# Patient Record
Sex: Female | Born: 1965
Health system: Southern US, Community
[De-identification: ages and names within clinical notes are randomized; demographics above are authoritative.]

## PROBLEM LIST (undated history)

## (undated) DIAGNOSIS — K589 Irritable bowel syndrome without diarrhea: Secondary | ICD-10-CM

## (undated) DIAGNOSIS — K5909 Other constipation: Secondary | ICD-10-CM

## (undated) DIAGNOSIS — K861 Other chronic pancreatitis: Secondary | ICD-10-CM

## (undated) DIAGNOSIS — R1084 Generalized abdominal pain: Secondary | ICD-10-CM

## (undated) DIAGNOSIS — E538 Deficiency of other specified B group vitamins: Secondary | ICD-10-CM

## (undated) DIAGNOSIS — A6009 Herpesviral infection of other urogenital tract: Secondary | ICD-10-CM

## (undated) DIAGNOSIS — N951 Menopausal and female climacteric states: Secondary | ICD-10-CM

## (undated) DIAGNOSIS — R635 Abnormal weight gain: Secondary | ICD-10-CM

## (undated) DIAGNOSIS — R1012 Left upper quadrant pain: Secondary | ICD-10-CM

## (undated) DIAGNOSIS — R1013 Epigastric pain: Secondary | ICD-10-CM

## (undated) DIAGNOSIS — N920 Excessive and frequent menstruation with regular cycle: Secondary | ICD-10-CM

## (undated) DIAGNOSIS — A6000 Herpesviral infection of urogenital system, unspecified: Secondary | ICD-10-CM

## (undated) DIAGNOSIS — R110 Nausea: Secondary | ICD-10-CM

## (undated) DIAGNOSIS — M25561 Pain in right knee: Secondary | ICD-10-CM

## (undated) DIAGNOSIS — K859 Acute pancreatitis without necrosis or infection, unspecified: Secondary | ICD-10-CM

## (undated) DIAGNOSIS — K5900 Constipation, unspecified: Secondary | ICD-10-CM

## (undated) DIAGNOSIS — J302 Other seasonal allergic rhinitis: Secondary | ICD-10-CM

## (undated) DIAGNOSIS — M4120 Other idiopathic scoliosis, site unspecified: Secondary | ICD-10-CM

## (undated) DIAGNOSIS — D473 Essential (hemorrhagic) thrombocythemia: Secondary | ICD-10-CM

## (undated) DIAGNOSIS — R232 Flushing: Secondary | ICD-10-CM

## (undated) HISTORY — PX: APPENDECTOMY: SHX54

## (undated) HISTORY — DX: Pain in right knee: M25.561

---
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
## 1995-08-10 HISTORY — PX: SPLENECTOMY, TOTAL: SHX788

## 1997-06-12 ENCOUNTER — Ambulatory Visit (HOSPITAL_COMMUNITY): Admission: RE | Admit: 1997-06-12 | Discharge: 1997-06-12 | Payer: Self-pay | Admitting: Family Medicine

## 1999-04-10 ENCOUNTER — Encounter: Payer: Self-pay | Admitting: Gastroenterology

## 1999-04-10 ENCOUNTER — Encounter: Admission: RE | Admit: 1999-04-10 | Discharge: 1999-04-10 | Payer: Self-pay | Admitting: Gastroenterology

## 1999-04-17 ENCOUNTER — Encounter: Payer: Self-pay | Admitting: Gastroenterology

## 1999-04-17 ENCOUNTER — Encounter: Admission: RE | Admit: 1999-04-17 | Discharge: 1999-04-17 | Payer: Self-pay | Admitting: Gastroenterology

## 1999-10-13 ENCOUNTER — Other Ambulatory Visit: Admission: RE | Admit: 1999-10-13 | Discharge: 1999-10-13 | Payer: Self-pay | Admitting: Obstetrics and Gynecology

## 2000-05-30 ENCOUNTER — Encounter: Payer: Self-pay | Admitting: Family Medicine

## 2000-05-30 ENCOUNTER — Encounter: Admission: RE | Admit: 2000-05-30 | Discharge: 2000-05-30 | Payer: Self-pay | Admitting: Family Medicine

## 2000-07-01 ENCOUNTER — Encounter: Payer: Self-pay | Admitting: Internal Medicine

## 2000-07-01 ENCOUNTER — Ambulatory Visit (HOSPITAL_COMMUNITY): Admission: RE | Admit: 2000-07-01 | Discharge: 2000-07-01 | Payer: Self-pay | Admitting: Internal Medicine

## 2001-08-18 ENCOUNTER — Ambulatory Visit (HOSPITAL_COMMUNITY): Admission: RE | Admit: 2001-08-18 | Discharge: 2001-08-18 | Payer: Self-pay | Admitting: Internal Medicine

## 2001-08-18 ENCOUNTER — Encounter: Payer: Self-pay | Admitting: Internal Medicine

## 2002-03-22 HISTORY — PX: KNEE ARTHROSCOPY: SUR90

## 2003-02-18 ENCOUNTER — Other Ambulatory Visit: Admission: RE | Admit: 2003-02-18 | Discharge: 2003-02-18 | Payer: Self-pay | Admitting: *Deleted

## 2003-05-18 ENCOUNTER — Emergency Department (HOSPITAL_COMMUNITY): Admission: EM | Admit: 2003-05-18 | Discharge: 2003-05-18 | Payer: Self-pay | Admitting: Emergency Medicine

## 2003-05-22 ENCOUNTER — Ambulatory Visit (HOSPITAL_COMMUNITY): Admission: RE | Admit: 2003-05-22 | Discharge: 2003-05-22 | Payer: Self-pay | Admitting: Internal Medicine

## 2003-06-24 ENCOUNTER — Encounter: Payer: Self-pay | Admitting: Internal Medicine

## 2003-08-21 ENCOUNTER — Encounter: Admission: RE | Admit: 2003-08-21 | Discharge: 2003-11-19 | Payer: Self-pay

## 2004-03-31 ENCOUNTER — Other Ambulatory Visit: Admission: RE | Admit: 2004-03-31 | Discharge: 2004-03-31 | Payer: Self-pay | Admitting: *Deleted

## 2004-10-28 ENCOUNTER — Encounter: Admission: RE | Admit: 2004-10-28 | Discharge: 2004-12-14 | Payer: Self-pay | Admitting: *Deleted

## 2005-04-14 ENCOUNTER — Other Ambulatory Visit: Admission: RE | Admit: 2005-04-14 | Discharge: 2005-04-14 | Payer: Self-pay | Admitting: *Deleted

## 2005-05-11 ENCOUNTER — Ambulatory Visit: Payer: Self-pay | Admitting: Internal Medicine

## 2005-06-04 ENCOUNTER — Encounter (INDEPENDENT_AMBULATORY_CARE_PROVIDER_SITE_OTHER): Payer: Self-pay | Admitting: Specialist

## 2005-06-04 ENCOUNTER — Ambulatory Visit: Payer: Self-pay | Admitting: Internal Medicine

## 2006-04-20 ENCOUNTER — Other Ambulatory Visit: Admission: RE | Admit: 2006-04-20 | Discharge: 2006-04-20 | Payer: Self-pay | Admitting: *Deleted

## 2006-08-24 ENCOUNTER — Emergency Department (HOSPITAL_COMMUNITY): Admission: EM | Admit: 2006-08-24 | Discharge: 2006-08-24 | Payer: Self-pay | Admitting: Emergency Medicine

## 2007-01-31 ENCOUNTER — Ambulatory Visit: Payer: Self-pay | Admitting: Internal Medicine

## 2007-04-24 ENCOUNTER — Other Ambulatory Visit: Admission: RE | Admit: 2007-04-24 | Discharge: 2007-04-24 | Payer: Self-pay | Admitting: Family Medicine

## 2007-05-11 DIAGNOSIS — K859 Acute pancreatitis without necrosis or infection, unspecified: Secondary | ICD-10-CM | POA: Insufficient documentation

## 2007-05-11 DIAGNOSIS — A6000 Herpesviral infection of urogenital system, unspecified: Secondary | ICD-10-CM

## 2007-05-11 DIAGNOSIS — K589 Irritable bowel syndrome without diarrhea: Secondary | ICD-10-CM

## 2007-05-11 HISTORY — DX: Acute pancreatitis without necrosis or infection, unspecified: K85.90

## 2007-05-11 HISTORY — DX: Herpesviral infection of urogenital system, unspecified: A60.00

## 2007-05-11 HISTORY — DX: Irritable bowel syndrome, unspecified: K58.9

## 2008-04-24 ENCOUNTER — Encounter: Payer: Self-pay | Admitting: Internal Medicine

## 2008-04-24 ENCOUNTER — Other Ambulatory Visit: Admission: RE | Admit: 2008-04-24 | Discharge: 2008-04-24 | Payer: Self-pay | Admitting: Family Medicine

## 2008-05-23 DIAGNOSIS — M4120 Other idiopathic scoliosis, site unspecified: Secondary | ICD-10-CM | POA: Insufficient documentation

## 2008-05-23 DIAGNOSIS — Z860100 Personal history of colon polyps, unspecified: Secondary | ICD-10-CM | POA: Insufficient documentation

## 2008-05-23 HISTORY — DX: Other idiopathic scoliosis, site unspecified: M41.20

## 2008-05-23 HISTORY — DX: Personal history of colon polyps, unspecified: Z86.0100

## 2008-05-23 HISTORY — DX: Personal history of colonic polyps: Z86.010

## 2008-05-29 ENCOUNTER — Ambulatory Visit: Payer: Self-pay | Admitting: Internal Medicine

## 2008-05-29 DIAGNOSIS — R1012 Left upper quadrant pain: Secondary | ICD-10-CM

## 2008-05-29 HISTORY — DX: Left upper quadrant pain: R10.12

## 2008-05-29 LAB — CONVERTED CEMR LAB: Retic Ct Pct: 1.2 % (ref 0.4–3.1)

## 2008-05-30 LAB — CONVERTED CEMR LAB
Lipase: 33 units/L (ref 11.0–59.0)
Vitamin B-12: 373 pg/mL (ref 211–911)

## 2008-06-03 ENCOUNTER — Telehealth: Payer: Self-pay | Admitting: Internal Medicine

## 2008-06-19 ENCOUNTER — Telehealth: Payer: Self-pay | Admitting: Internal Medicine

## 2008-06-25 ENCOUNTER — Ambulatory Visit (HOSPITAL_COMMUNITY): Admission: RE | Admit: 2008-06-25 | Discharge: 2008-06-25 | Payer: Self-pay | Admitting: Internal Medicine

## 2008-11-15 ENCOUNTER — Encounter (INDEPENDENT_AMBULATORY_CARE_PROVIDER_SITE_OTHER): Payer: Self-pay | Admitting: *Deleted

## 2008-11-19 ENCOUNTER — Ambulatory Visit: Payer: Self-pay | Admitting: Internal Medicine

## 2008-11-19 LAB — CONVERTED CEMR LAB
ALT: 16 units/L (ref 0–35)
AST: 28 units/L (ref 0–37)
Albumin: 4.1 g/dL (ref 3.5–5.2)
Amylase: 107 units/L (ref 27–131)
BUN: 17 mg/dL (ref 6–23)
Eosinophils Relative: 2.4 % (ref 0.0–5.0)
Glucose, Bld: 86 mg/dL (ref 70–99)
HCT: 41 % (ref 36.0–46.0)
Lipase: 28 units/L (ref 11.0–59.0)
Lymphocytes Relative: 31 % (ref 12.0–46.0)
Lymphs Abs: 2.4 10*3/uL (ref 0.7–4.0)
MCV: 103.1 fL — ABNORMAL HIGH (ref 78.0–100.0)
Monocytes Absolute: 0.5 10*3/uL (ref 0.1–1.0)
Monocytes Relative: 6.8 % (ref 3.0–12.0)
Neutrophils Relative %: 59.3 % (ref 43.0–77.0)
Platelets: 445 10*3/uL — ABNORMAL HIGH (ref 150.0–400.0)
Potassium: 4.2 meq/L (ref 3.5–5.1)
RBC: 3.98 M/uL (ref 3.87–5.11)
Sodium: 140 meq/L (ref 135–145)
WBC: 7.7 10*3/uL (ref 4.5–10.5)

## 2008-11-27 ENCOUNTER — Ambulatory Visit: Payer: Self-pay | Admitting: Internal Medicine

## 2008-11-27 ENCOUNTER — Encounter: Payer: Self-pay | Admitting: Internal Medicine

## 2008-11-29 ENCOUNTER — Encounter: Payer: Self-pay | Admitting: Internal Medicine

## 2008-12-20 ENCOUNTER — Ambulatory Visit: Payer: Self-pay | Admitting: Internal Medicine

## 2008-12-20 DIAGNOSIS — E538 Deficiency of other specified B group vitamins: Secondary | ICD-10-CM | POA: Insufficient documentation

## 2008-12-20 HISTORY — DX: Deficiency of other specified B group vitamins: E53.8

## 2009-01-20 ENCOUNTER — Ambulatory Visit: Payer: Self-pay | Admitting: Internal Medicine

## 2009-02-03 ENCOUNTER — Encounter: Payer: Self-pay | Admitting: Internal Medicine

## 2009-04-14 ENCOUNTER — Ambulatory Visit: Payer: Self-pay | Admitting: Internal Medicine

## 2009-05-13 ENCOUNTER — Other Ambulatory Visit: Admission: RE | Admit: 2009-05-13 | Discharge: 2009-05-13 | Payer: Self-pay | Admitting: Family Medicine

## 2009-05-14 ENCOUNTER — Ambulatory Visit: Payer: Self-pay | Admitting: Internal Medicine

## 2009-06-11 ENCOUNTER — Ambulatory Visit: Payer: Self-pay | Admitting: Internal Medicine

## 2009-08-27 ENCOUNTER — Ambulatory Visit: Payer: Self-pay | Admitting: Internal Medicine

## 2009-09-29 ENCOUNTER — Ambulatory Visit: Payer: Self-pay | Admitting: Internal Medicine

## 2009-11-03 ENCOUNTER — Ambulatory Visit: Payer: Self-pay | Admitting: Internal Medicine

## 2009-11-05 ENCOUNTER — Ambulatory Visit: Payer: Self-pay | Admitting: Internal Medicine

## 2009-11-28 ENCOUNTER — Encounter: Payer: Self-pay | Admitting: Physician Assistant

## 2009-11-28 ENCOUNTER — Telehealth: Payer: Self-pay | Admitting: Internal Medicine

## 2009-11-28 ENCOUNTER — Ambulatory Visit: Payer: Self-pay | Admitting: Gastroenterology

## 2009-11-28 DIAGNOSIS — R635 Abnormal weight gain: Secondary | ICD-10-CM

## 2009-11-28 DIAGNOSIS — M25561 Pain in right knee: Secondary | ICD-10-CM | POA: Insufficient documentation

## 2009-11-28 DIAGNOSIS — K5900 Constipation, unspecified: Secondary | ICD-10-CM

## 2009-11-28 DIAGNOSIS — R110 Nausea: Secondary | ICD-10-CM | POA: Insufficient documentation

## 2009-11-28 DIAGNOSIS — R1084 Generalized abdominal pain: Secondary | ICD-10-CM

## 2009-11-28 DIAGNOSIS — R1013 Epigastric pain: Secondary | ICD-10-CM | POA: Insufficient documentation

## 2009-11-28 DIAGNOSIS — K861 Other chronic pancreatitis: Secondary | ICD-10-CM | POA: Insufficient documentation

## 2009-11-28 HISTORY — DX: Constipation, unspecified: K59.00

## 2009-11-28 HISTORY — DX: Abnormal weight gain: R63.5

## 2009-11-28 HISTORY — DX: Generalized abdominal pain: R10.84

## 2009-11-28 HISTORY — DX: Epigastric pain: R10.13

## 2009-11-28 HISTORY — DX: Nausea: R11.0

## 2009-12-01 ENCOUNTER — Ambulatory Visit: Payer: Self-pay | Admitting: Internal Medicine

## 2009-12-02 LAB — CONVERTED CEMR LAB
ALT: 15 units/L (ref 0–35)
AST: 21 units/L (ref 0–37)
CRP, High Sensitivity: 0.41 (ref 0.00–5.00)
Calcium: 9.4 mg/dL (ref 8.4–10.5)
Chloride: 105 meq/L (ref 96–112)
Creatinine, Ser: 0.7 mg/dL (ref 0.4–1.2)
Eosinophils Relative: 6.3 % — ABNORMAL HIGH (ref 0.0–5.0)
HCT: 38.7 % (ref 36.0–46.0)
Hemoglobin: 13.4 g/dL (ref 12.0–15.0)
Lymphs Abs: 3.4 10*3/uL (ref 0.7–4.0)
Monocytes Relative: 6.6 % (ref 3.0–12.0)
Neutro Abs: 7.3 10*3/uL (ref 1.4–7.7)
RDW: 14 % (ref 11.5–14.6)
Sodium: 139 meq/L (ref 135–145)
WBC: 12.4 10*3/uL — ABNORMAL HIGH (ref 4.5–10.5)

## 2009-12-11 ENCOUNTER — Telehealth: Payer: Self-pay | Admitting: Internal Medicine

## 2009-12-12 ENCOUNTER — Ambulatory Visit: Payer: Self-pay | Admitting: Internal Medicine

## 2009-12-15 ENCOUNTER — Telehealth: Payer: Self-pay | Admitting: Internal Medicine

## 2009-12-16 ENCOUNTER — Ambulatory Visit: Payer: Self-pay | Admitting: Cardiovascular Disease

## 2009-12-29 ENCOUNTER — Ambulatory Visit: Payer: Self-pay | Admitting: Internal Medicine

## 2010-01-07 ENCOUNTER — Ambulatory Visit: Payer: Self-pay | Admitting: Internal Medicine

## 2010-02-04 ENCOUNTER — Ambulatory Visit: Payer: Self-pay | Admitting: Internal Medicine

## 2010-02-11 ENCOUNTER — Telehealth: Payer: Self-pay | Admitting: Internal Medicine

## 2010-04-12 ENCOUNTER — Encounter: Payer: Self-pay | Admitting: Internal Medicine

## 2010-04-23 NOTE — Progress Notes (Signed)
Summary: triage   Phone Note Call from Patient Call back at [phone]   Caller: Patient Call For: Dr. Juanda Chance Reason for Call: Talk to Nurse Summary of Call: pt has severe headaches and head pressure, stomach burning and pain, fluid retention, weight gain, unable to have a BM... has an ov tomorrow, but thinks she might need some kind of test scheduled for tomorrow instead Initial call taken by: Vallarie Mare,  December 11, 2009 9:08 AM  Follow-up for Phone Call        Dr Juanda Chance these are the same symptoms she had when she saw Mike Gip PA on 11/28/09, do you want any testing done prior to her appointment tomorrow? Follow-up by: Darcey Nora RN, CGRN,  December 11, 2009 10:38 AM  Additional Follow-up for Phone Call Additional follow up Details #1::        It sounds like she may need to see her PCP at  [facility] for a complete physical exam and comprehensive tests. For now she may try Align for the bloating etc.Her GI SWx'a are from an IBS but I cannot be sure that something else is going on. Additional Follow-up by: Hart Carwin MD,  December 11, 2009 1:19 PM    Additional Follow-up for Phone Call Additional follow up Details #2::    Patient advised of Dr Regino Schultze recommendations.  She wants to keep her appointment for tomorrow with Dr Juanda Chance.   Follow-up by: Darcey Nora RN, CGRN,  December 11, 2009 2:22 PM

## 2010-04-23 NOTE — Assessment & Plan Note (Signed)
Summary: MONTHLY B12 SHOT  Nurse Visit   Allergies: 1)  ! Amoxicillin 2)  ! * Ivp Dye 3)  ! * Bcp  Medication Administration  Injection # 1:    Medication: Vit B12 1000 mcg    Diagnosis: B12 DEFICIENCY (ICD-266.2)    Route: IM    Site: L deltoid    Exp Date: 06/21/2011    Lot #: [id number]    Mfr: [employer]    Patient tolerated injection without complications    Given by: Harlow Mares CMA (AAMA) (November 03, 2009 8:32 AM)  Orders Added: 1)  Vit B12 1000 mcg [J3420]

## 2010-04-23 NOTE — Assessment & Plan Note (Signed)
Summary: monthly b12 injection/dn  Nurse Visit   Allergies: 1)  ! Amoxicillin 2)  ! * Ivp Dye 3)  ! * Bcp  Medication Administration  Injection # 1:    Medication: Vit B12 1000 mcg    Diagnosis: B12 DEFICIENCY (ICD-266.2)    Route: IM    Site: L deltoid    Exp Date: 06/2011    Lot #: [id number]    Mfr: [employer]    Comments: pt to schedule next monthly b12 at front desk    Patient tolerated injection without complications    Given by: Chales Abrahams CMA Duncan Dull) (December 01, 2009 11:16 AM)  Orders Added: 1)  Vit B12 1000 mcg [J3420]

## 2010-04-23 NOTE — Progress Notes (Signed)
Summary: triage   Phone Note Call from Patient   Caller: Patient Call For: Dr. Juanda Chance Reason for Call: Talk to Nurse Summary of Call: pt walked in and asked to be seen right now stating that she thinks she has Pancreatitis and doesnt want to go through the weekend without being seen... pt waiting outside previsit... Cheryl notified Initial call taken by: Vallarie Mare,  November 28, 2009 3:07 PM  Follow-up for Phone Call        Says she has rt. and lt upper quadrant pain all this week along with nausea.Has chronic pain but says pain has become more intense. Discussed with PA and she will see pt. Follow-up by: Teryl Lucy RN,  November 28, 2009 3:16 PM

## 2010-04-23 NOTE — Assessment & Plan Note (Signed)
Summary: nausea /upper abd. pain/pancreatitis?    History of Present Illness Visit Type: Follow-up Visit Primary GI MD: Lina Sar MD Primary Toniyah Dilmore: [facility] Physician Requesting Jayzen Paver: na Chief Complaint: Headaches, generalized abd pain, nausea, and weight gain  History of Present Illness:   PLEASANT 44 YO FEMALE KNOWN TO DR. Juanda Chance WITH HX OF IBS AND PROBABLE CHRONIC PANCREATITS. SHE IS S/P SPLENECTOMY AND PARTIAL PANCREATECTOMY IN 1994 FOR A BENIGN SPLENIC MASS. SHE WAS SEEN ON 11/05/09 WITH NAUSEA,CONSTIPATION,JUST NOT FEELING WELL. PT WALK IN TODAY ASKING TO BE SEEN. SHE FEELS BAD. SHE C/O HEADACHE WHICH HAS BEEN CONSTANT ALL WEEK,BADLIKE ON TOP OF HEAD. WHEN IT IS BAD SHE FEELS NAUSEATED, AND DIZZY. SHE ALSO C/O CONSTANT BURNING UPPER ABDOMINAL PAIN,BLOATING GAS,"FEELS BLOCKED". HAS BEEN HAVING BM'S DAILY, NOT EVACUATING WELL. APPETITE POOR,NO VOMITING. HAS BEEN HAVING SPELLS OF FEELING VERY HOT-HAS NOT TAKEN TEMP. SHE WENT CAMPING FOR 9 DAYS MID AUGUST, NO STREAM WATER ETC, DID HAVE A SPIDER BITE, AND ALSO HAD ONE TICK ON HER, NOT ENGORGED . NOT AWARE OF A RASH, NO VISUAL DISTURBANCES , HAS BEEN WORKING. ALSO SAYS SHE FEELS SWOLLEN ,LEGS TIGHT,HAS GAINED 9-10 POUNDS IN PAST COUPLE WEEKS-FEELS SHE IS RETAINING FLUID.   GI Review of Systems    Reports abdominal pain, nausea, and  weight gain.     Location of  Abdominal pain: generalized.    Denies acid reflux, belching, bloating, chest pain, dysphagia with liquids, dysphagia with solids, heartburn, loss of appetite, vomiting, vomiting blood, and  weight loss.        Denies anal fissure, black tarry stools, change in bowel habit, constipation, diarrhea, diverticulosis, fecal incontinence, heme positive stool, hemorrhoids, irritable bowel syndrome, jaundice, light color stool, liver problems, rectal bleeding, and  rectal pain.    Current Medications (verified): 1)  Zenpep 5000 Unit Cpep (Pancrelipase (Lip-Prot-Amyl)) .... Take  2 Tablets By Mouth Three Times A Day (Pharmacy, Please D/c [facility] Prescription) 2)  Tramadol Hcl 50 Mg Tabs (Tramadol Hcl) .... Take 1 Tablet By Mouth Every 6 Hours As Needed For Abdominal Pain 3)  Cyanocobalamin 1000 Mcg/ml Soln (Cyanocobalamin) .... Once Monthly 4)  Citrucel  Powd (Methylcellulose (Laxative)) .... As Needed  Allergies (verified): 1)  ! Amoxicillin 2)  ! * Ivp Dye 3)  ! * Bcp  Past History:  Past Medical History: Reviewed history from 05/29/2008 and no changes required. SCOLIOSIS (ICD-737.30) COLONIC POLYPS, HX OF (ICD-V12.72) GENITAL HERPES (ICD-054.10) IRRITABLE BOWEL SYNDROME (ICD-564.1) PANCREATITIS (ICD-577.0)  Past Surgical History: splenectomy/partial pancreatectomy 1994-benign splenic mass appendectomy Rt knee arthroscopy  Family History: Reviewed history from 05/23/2008 and no changes required. No FH of Colon Cancer: Family History of Colitis/Crohn's: Mother (hx of u.c) Family History of Ovarian Cancer: Grandmother Family History of Prostate Cancer: Father Family History of Heart Disease: Father  Social History: Reviewed history from 05/29/2008 and no changes required. Single Alcohol Use - no Illicit Drug Use - no Patient has never smoked.   Review of Systems       The patient complains of back pain and headaches-new.  The patient denies allergy/sinus, anemia, anxiety-new, arthritis/joint pain, blood in urine, breast changes/lumps, change in vision, confusion, cough, coughing up blood, depression-new, fainting, fatigue, fever, hearing problems, heart murmur, heart rhythm changes, itching, menstrual pain, muscle pains/cramps, night sweats, nosebleeds, pregnancy symptoms, shortness of breath, skin rash, sleeping problems, sore throat, swelling of feet/legs, swollen lymph glands, thirst - excessive , urination - excessive , urination changes/pain, urine leakage, vision changes, and voice change.  see hpi  Vital Signs:  Patient profile:    45 year old female Height:      63 inches Weight:      127 pounds BMI:     22.58 BSA:     1.60 Temp:     98.4 degrees F oral Pulse rate:   72 / minute Pulse rhythm:   regular BP sitting:   110 / 64  (left arm) Cuff size:   regular  Vitals Entered By: Ok Anis CMA (November 28, 2009 3:30 PM)  Physical Exam  General:  Well developed, well nourished, no acute distress. Head:  Normocephalic and atraumatic. Eyes:  PERRLA, no icterus. Neck:  Supple; no masses or thyromegaly. Lungs:  Clear throughout to auscultation. Heart:  Regular rate and rhythm; no murmurs, rubs,  or bruits. Abdomen:  soft, mild tenderness upper abdomen, bs+ , nondistended, no mass or hsm Rectal:  not done Extremities:  trace edema lower extremities, no rash, old mark from spider bite on left calf Neurologic:  Alert and  oriented x4;  grossly normal neurologically. Psych:  Alert and cooperative. Normal mood and affect.   Impression & Recommendations:  Problem # 1:  ABDOMINAL PAIN-EPIGASTRIC (ICD-789.06) 44  YO FEMALE WITH IBS,PROBABLE CHRONIC PANCREATITIS(S/P PARTIAL PANCREATECTOMY/SPLENECTOMY 94), NOW WITH 6 DAY HX OF HEADACHE /FRONTAL AND TOP OF HEAD,NUSEA,UPPER ABDOMINAL BURNING PAIN, SUBJECTIVE FEVERS,FATIGUE.  DO NOT THINK THIS IS FLARE OF PANCREAS. GIVEN HX OF CAMPING ETC-NEED TO R/O [place] SPOTTED FEVER,LYME DISEASE.  LABS AS BELOW TO INCLUDE TITERS FOR RMSF, AND LYME TREAT WITH DOXCYCLINE EMPIRICALLY 100 MF TWICE DAILY X 7 DAYS KUB-R/O OBSTIPATION,PARTIAL OBSTRUCTION -DOUBT OBSTRUCTION. PRILOSEC 20 MG TWICE  DAILY X 2 WEEKS USE ULTRAM AT HOME AS NEEDED FOR ABDOMINAL PAIN,HEADACHE CALL IF ANY OF SXS WORSEN OVER NEXT FEW DAYS,AND SHE WILL CALL MONDAY WITH REPORT.  Problem # 2:  CHRONIC PANCREATITIS (ICD-577.1) Assessment: Comment Only  Problem # 3:  CONSTIPATION (ICD-564.00) Assessment: Comment Only  Other Orders: KUB (KUB) TLB-CMP (Comprehensive Metabolic Pnl) (80053-COMP) TLB-Amylase  (82150-AMYL) TLB-Lipase (83690-LIPASE) TLB-CRP-High Sensitivity (C-Reactive Protein) (86140-FCRP) TLB-CBC Platelet - w/Differential (85025-CBCD) T- * Misc. Laboratory test [phone]) T- * Misc. Laboratory test [phone])  Patient Instructions: 1)  Copy sent to : [facility] Physician 2)  Take Ultram as needed for pain 3)  Continue Pancreatic enzymes 4)  Take prilosec sampels 1 by mouth two times a day  5)  Go to lab and xray department today 6)  We have sent in a prescription to your pharmacy 7)  Please call back Monday with an update 8)  The medication list was reviewed and reconciled.  All changed / newly prescribed medications were explained.  A complete medication list was provided to the patient / caregiver. Prescriptions: DOXYCYCLINE HYCLATE 100 MG TABS (DOXYCYCLINE HYCLATE) 1 by mouth two times a day FOR 7 DAYS  #14 x 0   Entered by:   Merri Ray CMA (AAMA)   Authorized by:   Sammuel Cooper PA-c   Signed by:   Merri Ray CMA (AAMA) on 11/28/2009   Method used:   Electronically to        [facility]. [phone]* (retail)       [address]       [place], [place]  [address]       Ph: [phone]       Fax: [phone]   RxID:   [id number]

## 2010-04-23 NOTE — Assessment & Plan Note (Signed)
Summary: MONTHLY B12 SHOT...LSW.  Nurse Visit   Allergies: 1)  ! Amoxicillin 2)  ! * Ivp Dye 3)  ! * Bcp  Medication Administration  Injection # 1:    Medication: Vit B12 1000 mcg    Diagnosis: B12 DEFICIENCY (ICD-266.2)    Route: IM    Site: L deltoid    Exp Date: 09/2011    Lot #: [id number]    Mfr: [employer]    Patient tolerated injection without complications    Given by: Merri Ray CMA (AAMA) (January 07, 2010 9:01 AM)  Orders Added: 1)  Vit B12 1000 mcg [J3420]

## 2010-04-23 NOTE — Assessment & Plan Note (Signed)
Summary: F/U Constipation, fluid rentention, saw PA    History of Present Illness Visit Type: Follow-up Visit Primary GI MD: Lina Sar MD Primary Provider: [facility] Physician Requesting Provider: na Chief Complaint: Generalized abd pain, constipation, gas, and back pain  History of Present Illness:   This is a 45 year old female with irritable bowel syndrome, questionable history of pancreatitis and chronic constipation. She has had an exacerbation of symptoms in the last several weeks and has missed a lot of work. She saw Sondra Come, PA-C on 11/28/09 and was treated with Prilosec. She has not had regular bowel movements in spite of taking laxatives. She has had a 5 pound weight gain, mostly fluid. She is also complaining of headaches and burning in her chest. He last colonoscopy in September 2010 was a normal exam. An endoscopy in March 2007 was normal with no evidence of gastritis. A MRI of the pancreas in April 2010 showed a normal-appearing pancreas. Her mother has ulcerative colitis. Patient  prefers homeopathic medications. She is also unable to take any liquid laxatives.   GI Review of Systems    Reports abdominal pain and  bloating.     Location of  Abdominal pain: generalized.    Denies acid reflux, belching, chest pain, dysphagia with liquids, dysphagia with solids, heartburn, loss of appetite, nausea, vomiting, vomiting blood, weight loss, and  weight gain.      Reports constipation.     Denies anal fissure, black tarry stools, change in bowel habit, diarrhea, diverticulosis, fecal incontinence, heme positive stool, hemorrhoids, irritable bowel syndrome, jaundice, light color stool, liver problems, rectal bleeding, and  rectal pain.    Current Medications (verified): 1)  Zenpep 5000 Unit Cpep (Pancrelipase (Lip-Prot-Amyl)) .... Take 2 Tablets By Mouth Three Times A Day (Pharmacy, Please D/c Creon Prescription) 2)  Tramadol Hcl 50 Mg Tabs (Tramadol Hcl) .... Take 1 Tablet By  Mouth Every 6 Hours As Needed For Abdominal Pain 3)  Cyanocobalamin 1000 Mcg/ml Soln (Cyanocobalamin) .... Once Monthly 4)  Citrucel  Pack (Methylcellulose (Laxative)) .... Will Take Two Capsules By Mouth Once Daily  Allergies (verified): 1)  ! Amoxicillin 2)  ! * Ivp Dye 3)  ! * Bcp  Past History:  Past Medical History: Reviewed history from 05/29/2008 and no changes required. SCOLIOSIS (ICD-737.30) COLONIC POLYPS, HX OF (ICD-V12.72) GENITAL HERPES (ICD-054.10) IRRITABLE BOWEL SYNDROME (ICD-564.1) PANCREATITIS (ICD-577.0)  Past Surgical History: Reviewed history from 11/28/2009 and no changes required. splenectomy/partial pancreatectomy 1994-benign splenic mass appendectomy Rt knee arthroscopy  Family History: Reviewed history from 05/23/2008 and no changes required. No FH of Colon Cancer: Family History of Colitis/Crohn's: Mother (hx of u.c) Family History of Ovarian Cancer: Grandmother Family History of Prostate Cancer: Father Family History of Heart Disease: Father  Social History: Reviewed history from 05/29/2008 and no changes required. Single Alcohol Use - no Illicit Drug Use - no Patient has never smoked.   Review of Systems       The patient complains of back pain and headaches-new.  The patient denies allergy/sinus, anemia, anxiety-new, arthritis/joint pain, blood in urine, breast changes/lumps, change in vision, confusion, cough, coughing up blood, depression-new, fainting, fatigue, fever, hearing problems, heart murmur, heart rhythm changes, itching, menstrual pain, muscle pains/cramps, night sweats, nosebleeds, pregnancy symptoms, shortness of breath, skin rash, sleeping problems, sore throat, swelling of feet/legs, swollen lymph glands, thirst - excessive , urination - excessive , urination changes/pain, urine leakage, vision changes, and voice change.         Pertinent positive  and negative review of systems were noted in the above HPI. All other ROS was  otherwise negative.   Vital Signs:  Patient profile:   45 year old female Height:      63 inches Weight:      122 pounds BMI:     21.69 BSA:     1.57 Pulse rate:   76 / minute Pulse rhythm:   regular BP sitting:   112 / 64  (left arm) Cuff size:   regular  Vitals Entered By: Ok Anis CMA (December 12, 2009 8:59 AM)  Physical Exam  General:  anxious somewhat distraught alert and oriented Eyes:  PERRLA, no icterus. Mouth:  No deformity or lesions, dentition normal. Neck:  Supple; no masses or thyromegaly. Chest Wall:  no chest wall tenderness Lungs:  Clear throughout to auscultation. Heart:  Regular rate and rhythm; no murmurs, rubs,  or bruits. Abdomen:  mild tenderness in left and right upper quadrants with normoactive bowel sounds. Well-healed surgical scar. Normal lower abdomen. No palpable mass of stool. Mild CVA tenderness bilaterally most likely musculoskeletal Rectal:  normal rectal tone was small amount of Hemoccult negative stool Extremities:  trace edema Skin:  Intact without significant lesions or rashes. Psych:  Alert and cooperative. Normal mood and affect.   Impression & Recommendations:  Problem # 1:  CONSTIPATION (ICD-564.00) Orders: CT Abdomen/Pelvis w/o Contrast (CT ABD/Pel w/o con)  Problem # 2:  ABDOMINAL PAIN-EPIGASTRIC (ICD-789.06) I suspect a functional abdominal pain., possible fibromyalgia. pt appeared agitated today and had  multiple unrelated complaints. I suggested Cymbalta but she did not want to take any psychotropic medication.   Patient is to continue Prilosec 20 mg daily  Problem # 3:  CHRONIC PANCREATITIS (ICD-577.1) This was not documented on an MRI or blood tests but she has a hx of partial pancreatectomy in the past while undergoing splenectomy. She takes tramadol 50 mg which helps at times.  Patient Instructions: 1)  CT scan of the abdomen and pelvis with oral contrast. 2)  Prilosec 20 mg daily. 3)  Probiotics daily. 4)  Prunes  and Senokot daily forconstipation ( " the natural" laxatives) 5)  May need a complete physical exam by her PCP to look for other sources of complaints, specifically the possibility of fibromyalgia 6)  Copy sent to : [facility] 7)  The medication list was reviewed and reconciled.  All changed / newly prescribed medications were explained.  A complete medication list was provided to the patient / caregiver.

## 2010-04-23 NOTE — Progress Notes (Signed)
Summary: Triage   Phone Note Call from Patient Call back at Home Phone [phone]   Caller: Patient Call For: Dr. Juanda Chance Reason for Call: Talk to Nurse Summary of Call: chest pain earlier this morning, tongue is coated...CT scan is sch'd for tomorrow Initial call taken by: Karna Christmas,  December 15, 2009 3:11 PM  Follow-up for Phone Call        Patient  advised to keep the appointment for the CT scan in the am, and she will be called with the results when they are available.  CP has resolved. Follow-up by: Darcey Nora RN, CGRN,  December 15, 2009 3:40 PM

## 2010-04-23 NOTE — Assessment & Plan Note (Signed)
Summary: ROUTINE F/U PANCREATITIS/DN    History of Present Illness Visit Type: Follow-up Visit Primary GI MD: Lina Sar MD Primary Provider: [facility] Requesting Provider: Clovis Riley, FNP Chief Complaint: pancreatitis, patient states that she feel blah, with some nausea History of Present Illness:   This is a 45 year old white female with irritable bowel syndrome and chronic abdominal pain and suspected chronic pancreatitis. She is status post partial pancreatectomy in 1994 and splenectomy for  benign splenic mass. She had a normal appearing pancreas on an MRI in April 2010. She has 2 accessory splenules. Her bowel habits have been irregular with predominant constipation. She takes pancreatic enzyme supplements with relief of some of her bloating. She had to miss work twice last month because of abdominal pain. Her weight has been stable. An upper endoscopy in 2007 was normal. A colonoscopy in September 2010 was also normal. A prior colonoscopy in 2005 showed polyps.   GI Review of Systems    Reports abdominal pain and  nausea.     Location of  Abdominal pain: left side.    Denies acid reflux, belching, bloating, chest pain, dysphagia with liquids, dysphagia with solids, heartburn, loss of appetite, vomiting, vomiting blood, weight loss, and  weight gain.      Reports change in bowel habits, constipation, and  diarrhea.     Denies anal fissure, black tarry stools, diverticulosis, fecal incontinence, heme positive stool, hemorrhoids, irritable bowel syndrome, jaundice, light color stool, liver problems, rectal bleeding, and  rectal pain.    Current Medications (verified): 1)  Creon 6000 Unit Cpep (Pancrelipase (Lip-Prot-Amyl)) .... Take 2 Tablets By Mouth Three Times A Day  Allergies (verified): 1)  ! Amoxicillin 2)  ! * Ivp Dye 3)  ! * Bcp  Past History:  Past Medical History: Reviewed history from 05/29/2008 and no changes required. SCOLIOSIS (ICD-737.30) COLONIC  POLYPS, HX OF (ICD-V12.72) GENITAL HERPES (ICD-054.10) IRRITABLE BOWEL SYNDROME (ICD-564.1) PANCREATITIS (ICD-577.0)  Past Surgical History: Reviewed history from 05/23/2008 and no changes required. splenectomy appendectomy Rt knee arthroscopy  Family History: Reviewed history from 05/23/2008 and no changes required. No FH of Colon Cancer: Family History of Colitis/Crohn's: Mother (hx of u.c) Family History of Ovarian Cancer: Grandmother Family History of Prostate Cancer: Father Family History of Heart Disease: Father  Social History: Reviewed history from 05/29/2008 and no changes required. Single Alcohol Use - no Illicit Drug Use - no Patient has never smoked.   Review of Systems       The patient complains of anemia, fever, muscle pains/cramps, night sweats, and sleeping problems.  The patient denies allergy/sinus, anxiety-new, arthritis/joint pain, back pain, blood in urine, breast changes/lumps, change in vision, confusion, cough, coughing up blood, depression-new, fainting, fatigue, headaches-new, hearing problems, heart murmur, heart rhythm changes, itching, menstrual pain, nosebleeds, pregnancy symptoms, shortness of breath, skin rash, sore throat, swelling of feet/legs, swollen lymph glands, thirst - excessive, urination - excessive, urination changes/pain, urine leakage, vision changes, and voice change.         Pertinent positive and negative review of systems were noted in the above HPI. All other ROS was otherwise negative.   Vital Signs:  Patient profile:   45 year old female Height:      63 inches Weight:      124.38 pounds BMI:     22.11 Pulse rate:   80 / minute Pulse rhythm:   regular BP sitting:   90 / 52  (left arm) Cuff size:  regular  Vitals Entered By: June McMurray CMA Duncan Dull) (November 05, 2009 8:36 AM)  Physical Exam  General:  Well developed, well nourished, no acute distress. Mouth:  No deformity or lesions, dentition normal. Neck:  Supple;  no masses or thyromegaly. Lungs:  Clear throughout to auscultation. Heart:  Regular rate and rhythm; no murmurs, rubs,  or bruits. Abdomen:  tender left upper quadrant along the costal margin and in epigastrium. Well-healed surgical scar in upper abdomen. Normoactive bowel sounds. No distention. Extremities:  trace edema in both lower extremities. Skin:  Intact without significant lesions or rashes. Psych:  Alert and cooperative. Normal mood and affect.   Impression & Recommendations:  Problem # 1:  B12 DEFICIENCY (ICD-266.2) Patient's last B12 level was 482 on 06/11/09. She continues on monthly supplements.  Problem # 2:  ABDOMINAL PAIN, LEFT UPPER QUADRANT (ICD-789.02) Patient has chronic abdominal pain, likely overlapped with irritable bowel syndrome, postoperative adhesions and possibly a low-grade pancreatitis without radiographic evidence of pancreatic abnormality. She will continue on her pancreatic enzymes.  Problem # 3:  COLONIC POLYPS, HX OF (ICD-V12.72) She is up-to-date on her colonoscopy. Her next exam will be due in 10 years which would be in 2019.  Patient Instructions: 1)  tramadol 50 mg p.r.n. abdominal pain. 2)  Start calcium supplements with vitamin D. 3)  Refill on pancreatic enzymes. 4)  Continue B12 supplements monthly. 5)  MiraLax 17 g p.r.n. constipation. 6)  Copy sent to : Dr Clovis Riley 7)  The medication list was reviewed and reconciled.  All changed / newly prescribed medications were explained.  A complete medication list was provided to the patient / caregiver. Prescriptions: TRAMADOL HCL 50 MG TABS (TRAMADOL HCL) Take 1 tablet by mouth every 6 hours as needed for abdominal pain  #60 x 0   Entered by:   Lamona Curl CMA (AAMA)   Authorized by:   Hart Carwin MD   Signed by:   Lamona Curl CMA (AAMA) on 11/05/2009   Method used:   Electronically to        [facility]. [phone]* (retail)       [address]       [place], [place]  [address]       Ph: [phone]       Fax: [phone]   RxID:   [id number]   Appended Document: ROUTINE F/U PANCREATITIS/DN Patient requested we change her from creon back to pancrealipase. Unfortunately, pancrealipase is no longer available on the market. Per Dr Juanda Chance, okay to try patient on Zenpep at its lowest dosage. I have called patient to advise her of the change in medication and I have sent in a new prescription for this. Dottie Nelson-Smith CMA (AAMA)  November 06, 2009 2:28 PM    Clinical Lists Changes  Medications: Changed medication from CREON 6000 UNIT CPEP (PANCRELIPASE (LIP-PROT-AMYL)) Take 2 tablets by mouth three times a day to ZENPEP 5000 UNIT CPEP (PANCRELIPASE (LIP-PROT-AMYL)) Take 2 tablets by mouth three times a day (pharmacy, please d/c creon prescription) - Signed Rx of ZENPEP 5000 UNIT CPEP (PANCRELIPASE (LIP-PROT-AMYL)) Take 2 tablets by mouth three times a day (pharmacy, please d/c creon prescription);  #180 x 2;  Signed;  Entered by: Lamona Curl CMA (AAMA);  Authorized by: Hart Carwin MD;  Method used: Electronically to [facility]. [phone]*, [address] [place], [place], [place]  [address], Ph: [phone], Fax: [phone]  Prescriptions: ZENPEP 5000 UNIT CPEP (PANCRELIPASE (LIP-PROT-AMYL)) Take 2 tablets by mouth three times a day (pharmacy, please d/c creon prescription)  #180 x 2   Entered by:   Lamona Curl CMA (AAMA)   Authorized by:   Hart Carwin MD   Signed by:   Lamona Curl CMA (AAMA) on 11/06/2009   Method used:   Electronically to        [facility]. [phone]* (retail)       [address]       [place], [place]  [address]       Ph: [phone]       Fax: [phone]   RxID:   [id number]

## 2010-04-23 NOTE — Assessment & Plan Note (Signed)
Summary: monthly b12...as.  Nurse Visit  Medication Administration  Injection # 1:    Medication: Vit B12 1000 mcg    Diagnosis: B12 DEFICIENCY (ICD-266.2)    Route: IM    Site: R deltoid    Exp Date: 06/2011    Lot #: [id number]    Comments: Manufactured by [employer] Pt will return in one month for next injection.  Advised pt of national shortage and the possibility we may not have any B12 at the time of her next appt. Pt voices understanding and is aware we will call her in advance if we need to cancel her appt.    Patient tolerated injection without complications    Given by: Francee Piccolo CMA Duncan Dull) (August 27, 2009 8:59 AM)  Orders Added: 1)  Vit B12 1000 mcg [J3420]

## 2010-04-23 NOTE — Progress Notes (Signed)
Summary: Changed GI   Phone Note Outgoing Call   Call placed by: Lamona Curl CMA Duncan Dull),  February 11, 2010 8:38 AM Call placed to: Patient Summary of Call: We recieved a release of information request for patient's information to go to [facility] (patient was seen there 02/10/10). I called patient to advise her that due to her transfer of care to [facility], we will be canceling her b12 appointment for 03/09/10. Patient states that she "isnt neccesarily transfering care but is getting a second opinion." I have advised her that we consider going to another GI without discussing with Dr Juanda Chance first as a transfer of care and that if she would like to see Dr Juanda Chance again, then she would need to have all [facility] information faxed to our office for Dr Juanda Chance to review and decide if she would like to accept patient back. Patient verbalizes understanding. Initial call taken by: Lamona Curl CMA Duncan Dull),  February 11, 2010 8:41 AM

## 2010-04-23 NOTE — Assessment & Plan Note (Signed)
Summary: b12/yf  # 3 of 6  Nurse Visit   Allergies: 1)  ! Amoxicillin 2)  ! * Ivp Dye 3)  ! * Bcp  Medication Administration  Injection # 1:    Medication: Vit B12 1000 mcg    Diagnosis: B12 DEFICIENCY (ICD-266.2)    Route: IM    Site: L deltoid    Exp Date: 01/2011    Lot #: [id number]    Mfr: [employer]    Comments: pt to schedule next monthly at front desk.    Patient tolerated injection without complications    Given by: Chales Abrahams CMA Duncan Dull) (April 14, 2009 8:46 AM)  Orders Added: 1)  Vit B12 1000 mcg [J3420]

## 2010-04-23 NOTE — Assessment & Plan Note (Signed)
Summary: MONTHLY B12 SHOT...LSW.  Nurse Visit   Allergies: 1)  ! Amoxicillin 2)  ! * Ivp Dye 3)  ! * Bcp  Medication Administration  Injection # 1:    Medication: Vit B12 1000 mcg    Diagnosis: B12 DEFICIENCY (ICD-266.2)    Route: IM    Site: L deltoid    Exp Date: 06/2011    Lot #: [id number]    Mfr: [employer]    Patient tolerated injection without complications    Given by: Merri Ray CMA (AAMA) (September 29, 2009 8:43 AM)  Orders Added: 1)  Vit B12 1000 mcg [J3420]

## 2010-04-23 NOTE — Assessment & Plan Note (Signed)
Summary: MONTHLY B12 SHOT...LSW.  Nurse Visit   Allergies: 1)  ! Amoxicillin 2)  ! * Ivp Dye 3)  ! * Bcp  Medication Administration  Injection # 1:    Medication: Vit B12 1000 mcg    Diagnosis: B12 DEFICIENCY (ICD-266.2)    Route: IM    Site: R deltoid    Exp Date: 10/2010    Lot #: [id number]    Mfr: [employer]    Comments: pt scheduled for next monthly B12    Patient tolerated injection without complications    Given by: Merri Ray CMA Duncan Dull) (May 14, 2009 8:50 AM)  Orders Added: 1)  Vit B12 1000 mcg [J3420]

## 2010-04-23 NOTE — Assessment & Plan Note (Signed)
Summary: MONTHLY B12 SHOT...LSW.  Nurse Visit   Allergies: 1)  ! Amoxicillin 2)  ! * Ivp Dye 3)  ! * Bcp  Medication Administration  Injection # 1:    Medication: Vit B12 1000 mcg    Diagnosis: B12 DEFICIENCY (ICD-266.2)    Route: IM    Site: L deltoid    Exp Date: 10/2011    Lot #: [id number]    Mfr: [employer]    Comments: Monthly B12 Injection. Patient will make appointment for next B12 injection    Patient tolerated injection without complications    Given by: Ok Anis CMA (February 04, 2010 8:58 AM)  Orders Added: 1)  Vit B12 1000 mcg [J3420]

## 2010-04-23 NOTE — Assessment & Plan Note (Signed)
Summary: MONTHLY B12 SHOT...LSW.  Nurse Visit   Allergies: 1)  ! Amoxicillin 2)  ! * Ivp Dye 3)  ! * Bcp  Medication Administration  Injection # 1:    Medication: Vit B12 1000 mcg    Diagnosis: B12 DEFICIENCY (ICD-266.2)    Route: IM    Site: L deltoid    Exp Date: 12/12    Lot #: [id number]    Mfr: [employer]    Patient tolerated injection without complications    Given by: Hortense Ramal CMA Duncan Dull) (June 11, 2009 8:43 AM)  Orders Added: 1)  Vit B12 1000 mcg [J3420]

## 2010-07-15 ENCOUNTER — Other Ambulatory Visit: Payer: Self-pay | Admitting: Specialist

## 2010-07-20 ENCOUNTER — Other Ambulatory Visit: Payer: Self-pay

## 2010-07-20 ENCOUNTER — Ambulatory Visit
Admission: RE | Admit: 2010-07-20 | Discharge: 2010-07-20 | Disposition: A | Discharge status: Home / Self Care | Payer: 59 | Source: Ambulatory Visit | Attending: Specialist | Admitting: Specialist

## 2010-07-20 MED ORDER — GADOBENATE DIMEGLUMINE 529 MG/ML IV SOLN
12.0000 mL | Freq: Once | INTRAVENOUS | Status: AC | PRN
  Administered 2010-07-20: 12 mL via INTRAVENOUS

## 2010-08-04 NOTE — Assessment & Plan Note (Signed)
[facility]                         GASTROENTEROLOGY OFFICE NOTE   Deborah [name], Deborah [name]                 MRN:          [id number]  DATE:01/31/2007                            DOB:          September 16, 1965    Deborah [name] is a 45 year old white female with chronic low grade  pancreatitis, irritable bowel syndrome whom we have followed now for a  number of years,  She has stable disease of chronic pancreatitis. She  had a partial pancreatectomy and splenectomy in the past. Last  evaluation was done in March 2007, when she underwent upper endoscopy.  Last colonoscopy April 2005.   Her only medications include:  1. Lipram.  2. Pancreatic enzyme supplement one or two with each meal. She seemed      to have her symptoms relieved.   Since April of this year, she has been a foster parent to two boys, 36  and 73 year old and has been under a great deal of stress as she has  also been working full time. She has lost about 2 pounds. She was  hospitalized in June 2008 with acute gastroenteritis. We were not called  to see her. She was on [facility]. The patient responded to  hydration.   PHYSICAL EXAMINATION:  Blood pressure 98/62, pulse 66, weight 114  pounds. She was alert, oriented and in no distress.  LUNGS: Clear to auscultation.  COR: Normal S1, normal S2.  ABDOMEN: Soft, with well-healed surgical scar in the upper abdomen.  Normoactive bowel sounds. Tenderness to the left of the midline and to  the right of the midline. Liver edge at costal margin. No c.v.a.  tenderness.   IMPRESSION:  A 45 year old white female with stable low grade pancreatic  disease, status post remote partial pancreatectomy and splenectomy. The  patient is doing well on pancreatic enzyme replacement.   PLAN:  1. Refill Lipram 1-2 with each meal.  2. No further GI evaluation is planned. If she has a flare up I would      consider endoscopic ultrasound by Dr. Christella Hartigan  to further define her      pancreatic disease. I will see her again in one year.     Deborah [name]. Deborah Chance, MD  Electronically Signed   DMB/MedQ  DD: 01/31/2007  DT: 01/31/2007  Job #: [id number]   cc:   Deborah Riley, MD

## 2010-08-07 NOTE — Procedures (Signed)
[facility]  Patient:    Deborah [name], Deborah [name]                 MRN: [id number] Proc. Date: 07/01/00 Adm. Date:  [id number] Attending:  Mervin Hack CC:         Chales Salmon. Abigail Miyamoto, M.D.   Procedure Report  PROCEDURE:  Endoscopic retrograde cholangiopancreatography.  INDICATIONS FOR PROCEDURE:  This 45 year old white female has had chronic left upper quadrant abdominal pain which started before 1996 when she underwent exploratory laparotomy and removal of accessory spleen which was partially adherent the tail of the pancreas.  The pain continued after the surgery.  She has been treated for irritable bowel syndrome, but the pain has persisted. She denies use of alcohol.  CT scan of the abdomen recently was negative as far as pancreatitis, but her amylase was elevated.  She is undergoing endoscopic retrograde cholangiopancreatography because of suspected chronic pancreatitis to rule out changes in the tail of the pancreas such as chronic pancreatitis, pseudocyst, or any obstruction of the duct in the tail of the pancreas.  ENDOSCOPE:  Olympus side-viewing duodenal scope.  SEDATION: 1. Versed 10 mg IV. 2. Demerol 100 mg IV.  FINDINGS:  The Olympus single-chamber side-viewing duodenoscope passed through the esophagus into the stomach.  Gastric mucosa was normal with normal gastric antrum, body, and pyloric outlet.  Biopsies were taken of gastric antrum for CLO test.  Duodenal bulb was unremarkable.  Papilla was visualized without difficulty and showed normal appearance.  There was no deformity of the papilla which was cannulated without difficulty.  The pancreatic duct was cannulated, and was filled and showed normal diameter in the head, body, and tail of the pancreas.  Fluoroscopic guidance was provided by Dr. Stevphen Meuse. The accini were visualized in the head of the pancreas, but the body and the tail of the pancreas did not show filling of  the secondary duct.  The tail did not appear to be deformed, sacculated, or in any way distorted.  It emptied rapidly.  She was comfortable during the procedure.  Common bile duct was not attempted to be filled.  The patient tolerated the procedure well.  IMPRESSION:  Normal endoscopic retrograde pancreatography, no evidence of chronic pancreatitis.  PLAN:  In absence of any anatomic deformities of the tail of the pancreas, there is nothing we can do other than treat the patient conservatively with strict low fat diet, trial of pancreatic enzymes, which so far have not made much difference, and monitor her amylase and lipase. DD:  07/01/00 TD:  07/01/00 Job: [id number]

## 2011-01-07 LAB — URINALYSIS, ROUTINE W REFLEX MICROSCOPIC
Hgb urine dipstick: NEGATIVE
Specific Gravity, Urine: 1.025
Urobilinogen, UA: 1
pH: 6

## 2011-01-07 LAB — DIFFERENTIAL
Basophils Absolute: 0
Lymphocytes Relative: 3 — ABNORMAL LOW
Lymphs Abs: 0.6 — ABNORMAL LOW
Monocytes Absolute: 0.5
Monocytes Relative: 2 — ABNORMAL LOW
Neutro Abs: 19.6 — ABNORMAL HIGH

## 2011-01-07 LAB — COMPREHENSIVE METABOLIC PANEL
Albumin: 4
BUN: 21
Calcium: 8.8
Creatinine, Ser: 0.81
GFR calc Af Amer: >60
Total Bilirubin: 0.9
Total Protein: 7.1

## 2011-01-07 LAB — CBC
HCT: 42.8
MCHC: 33.7
MCV: 98.3
Platelets: 419 — ABNORMAL HIGH
RDW: 13.8

## 2011-01-07 LAB — POCT PREGNANCY, URINE: Preg Test, Ur: NEGATIVE

## 2011-01-07 LAB — URINE MICROSCOPIC-ADD ON

## 2011-07-12 NOTE — H&P (Addendum)
46 yo w/ menorrhagia & dysmenorrhea presents for surgical mngt.  Pt was tried on microgestin without relief of her sx.  Heavy vb associated with clotting and severe cramping.   PMHx:  HSV, IBS, migraines PSHx:  Splenectomy, Appendectomy, knee surgery All:  Amox, lohexol Meds:  Valtrex, pancrelipase, tramadol Shx:  No tobacco, etoh or IVDU.  homesexual relationship FHX:  Heart dz, ovarian ca (MGM)  AF, VSS Gen - NAD CV - RRR Lungs - clear bilaterally Abd - soft, NT Ext - NT, no edema PV - wnl  [place] - normal appearing uterus and bilateral adnexa.  ? Polyp in cervix  A/P:  Menorrhagia and dysmenorrhea with no improvement despite medical mngt presents for surgical mngt.  R/B/A discussed and informed consent  Plan of care discussed with pt.  Questions answered.

## 2011-07-13 ENCOUNTER — Encounter (HOSPITAL_BASED_OUTPATIENT_CLINIC_OR_DEPARTMENT_OTHER): Payer: Self-pay | Admitting: *Deleted

## 2011-07-13 NOTE — Progress Notes (Signed)
NPO AFTER MN. ARRIVES AT 0600. NEEDS CBC AND URINE PREG. 

## 2011-07-16 ENCOUNTER — Encounter (HOSPITAL_BASED_OUTPATIENT_CLINIC_OR_DEPARTMENT_OTHER)
Admission: RE | Disposition: A | Discharge status: Home / Self Care | Payer: Self-pay | Source: Ambulatory Visit | Attending: Obstetrics and Gynecology

## 2011-07-16 ENCOUNTER — Ambulatory Visit (HOSPITAL_BASED_OUTPATIENT_CLINIC_OR_DEPARTMENT_OTHER): Payer: 59 | Admitting: Anesthesiology

## 2011-07-16 ENCOUNTER — Encounter (HOSPITAL_BASED_OUTPATIENT_CLINIC_OR_DEPARTMENT_OTHER): Payer: Self-pay | Admitting: Anesthesiology

## 2011-07-16 ENCOUNTER — Encounter (HOSPITAL_BASED_OUTPATIENT_CLINIC_OR_DEPARTMENT_OTHER): Payer: Self-pay | Admitting: *Deleted

## 2011-07-16 ENCOUNTER — Ambulatory Visit (HOSPITAL_BASED_OUTPATIENT_CLINIC_OR_DEPARTMENT_OTHER)
Admission: RE | Admit: 2011-07-16 | Discharge: 2011-07-16 | Disposition: A | Discharge status: Home / Self Care | Payer: 59 | Source: Ambulatory Visit | Attending: Obstetrics and Gynecology | Admitting: Obstetrics and Gynecology

## 2011-07-16 DIAGNOSIS — N8501 Benign endometrial hyperplasia: Secondary | ICD-10-CM | POA: Insufficient documentation

## 2011-07-16 DIAGNOSIS — N920 Excessive and frequent menstruation with regular cycle: Secondary | ICD-10-CM | POA: Insufficient documentation

## 2011-07-16 DIAGNOSIS — Z79899 Other long term (current) drug therapy: Secondary | ICD-10-CM | POA: Insufficient documentation

## 2011-07-16 DIAGNOSIS — N946 Dysmenorrhea, unspecified: Secondary | ICD-10-CM | POA: Insufficient documentation

## 2011-07-16 DIAGNOSIS — R109 Unspecified abdominal pain: Secondary | ICD-10-CM | POA: Insufficient documentation

## 2011-07-16 HISTORY — DX: Flushing: R23.2

## 2011-07-16 HISTORY — DX: Other chronic pancreatitis: K86.1

## 2011-07-16 HISTORY — DX: Herpesviral infection of other urogenital tract: A60.09

## 2011-07-16 HISTORY — DX: Other seasonal allergic rhinitis: J30.2

## 2011-07-16 HISTORY — DX: Other constipation: K59.09

## 2011-07-16 HISTORY — DX: Irritable bowel syndrome, unspecified: K58.9

## 2011-07-16 HISTORY — DX: Excessive and frequent menstruation with regular cycle: N92.0

## 2011-07-16 HISTORY — DX: Menopausal and female climacteric states: N95.1

## 2011-07-16 LAB — CBC
MCH: 33.6 pg (ref 26.0–34.0)
MCHC: 34.8 g/dL (ref 30.0–36.0)
Platelets: 538 10*3/uL — ABNORMAL HIGH (ref 150–400)
RBC: 3.87 MIL/uL (ref 3.87–5.11)

## 2011-07-16 SURGERY — DILATATION & CURETTAGE/HYSTEROSCOPY WITH NOVASURE ABLATION
Anesthesia: General | Site: Uterus | Wound class: Clean Contaminated

## 2011-07-16 MED ORDER — IBUPROFEN 200 MG PO TABS: 800.0000 mg | ORAL_TABLET | Freq: Three times a day (TID) | ORAL | Status: AC | PRN

## 2011-07-16 MED ORDER — LACTATED RINGERS IV SOLN: INTRAVENOUS | Status: DC

## 2011-07-16 MED ORDER — FENTANYL CITRATE 0.05 MG/ML IJ SOLN
INTRAMUSCULAR | Status: DC | PRN
  Administered 2011-07-16: 25 ug via INTRAVENOUS
  Administered 2011-07-16: 100 ug via INTRAVENOUS
  Administered 2011-07-16 (×3): 25 ug via INTRAVENOUS

## 2011-07-16 MED ORDER — DEXAMETHASONE SODIUM PHOSPHATE 4 MG/ML IJ SOLN
INTRAMUSCULAR | Status: DC | PRN
  Administered 2011-07-16: 4 mg via INTRAVENOUS

## 2011-07-16 MED ORDER — LACTATED RINGERS IV SOLN
INTRAVENOUS | Status: DC
  Administered 2011-07-16: 07:00:00 via INTRAVENOUS

## 2011-07-16 MED ORDER — STERILE WATER FOR IRRIGATION IR SOLN
Status: DC | PRN
  Administered 2011-07-16: 500 mL

## 2011-07-16 MED ORDER — KETOROLAC TROMETHAMINE 30 MG/ML IJ SOLN
INTRAMUSCULAR | Status: DC | PRN
  Administered 2011-07-16: 30 mg via INTRAVENOUS

## 2011-07-16 MED ORDER — ONDANSETRON HCL 4 MG/2ML IJ SOLN
INTRAMUSCULAR | Status: DC | PRN
  Administered 2011-07-16: 4 mg via INTRAVENOUS

## 2011-07-16 MED ORDER — PROPOFOL 10 MG/ML IV EMUL
INTRAVENOUS | Status: DC | PRN
  Administered 2011-07-16: 200 mg via INTRAVENOUS

## 2011-07-16 MED ORDER — LIDOCAINE HCL 1 % IJ SOLN
INTRAMUSCULAR | Status: DC | PRN
  Administered 2011-07-16: 10 mL

## 2011-07-16 MED ORDER — LIDOCAINE HCL (CARDIAC) 20 MG/ML IV SOLN
INTRAVENOUS | Status: DC | PRN
  Administered 2011-07-16: 80 mg via INTRAVENOUS

## 2011-07-16 MED ORDER — HYDROCODONE-ACETAMINOPHEN 5-500 MG PO TABS: 1.0000 | ORAL_TABLET | ORAL | Status: AC | PRN

## 2011-07-16 MED ORDER — FENTANYL CITRATE 0.05 MG/ML IJ SOLN: 25.0000 ug | INTRAMUSCULAR | Status: DC | PRN

## 2011-07-16 MED ORDER — PROMETHAZINE HCL 25 MG/ML IJ SOLN: 6.2500 mg | INTRAMUSCULAR | Status: DC | PRN

## 2011-07-16 MED ORDER — MIDAZOLAM HCL 5 MG/5ML IJ SOLN
INTRAMUSCULAR | Status: DC | PRN
  Administered 2011-07-16: 2 mg via INTRAVENOUS

## 2011-07-16 MED ORDER — LACTATED RINGERS IV SOLN
INTRAVENOUS | Status: DC | PRN
  Administered 2011-07-16: 3000 mL via INTRAUTERINE

## 2011-07-16 MED ORDER — LACTATED RINGERS IV SOLN
INTRAVENOUS | Status: DC | PRN
  Administered 2011-07-16 (×2): via INTRAVENOUS

## 2011-07-16 SURGICAL SUPPLY — 34 items
ABLATOR ENDOMETRIAL BIPOLAR (ABLATOR) ×2 IMPLANT
CANISTER SUCTION 2500CC (MISCELLANEOUS) IMPLANT
CATH ROBINSON RED A/P 16FR (CATHETERS) ×2 IMPLANT
CLOTH BEACON ORANGE TIMEOUT ST (SAFETY) ×2 IMPLANT
CORD ACTIVE DISPOSABLE (ELECTRODE)
CORD ELECTRO ACTIVE DISP (ELECTRODE) IMPLANT
COVER TABLE BACK 60X90 (DRAPES) ×2 IMPLANT
DRAPE CAMERA CLOSED 9X96 (DRAPES) ×2 IMPLANT
DRAPE LG THREE QUARTER DISP (DRAPES) ×2 IMPLANT
ELECT LOOP GYNE PRO 24FR (CUTTING LOOP)
ELECTRODE LOOP GYNE PRO 24FR (CUTTING LOOP) IMPLANT
GLOVE BIO SURGEON STRL SZ 6 (GLOVE) ×1 IMPLANT
GLOVE BIO SURGEON STRL SZ 6.5 (GLOVE) ×4 IMPLANT
GLOVE BIOGEL PI IND STRL 7.0 (GLOVE) ×1 IMPLANT
GLOVE BIOGEL PI INDICATOR 7.0 (GLOVE) ×1
GLOVE ECLIPSE 6.5 STRL STRAW (GLOVE) ×1 IMPLANT
GLOVE SURG SS PI 6.0 STRL IVOR (GLOVE) ×1 IMPLANT
GOWN PREVENTION PLUS LG XLONG (DISPOSABLE) ×2 IMPLANT
GOWN STRL REIN XL XLG (GOWN DISPOSABLE) ×2 IMPLANT
GOWN SURGICAL LARGE (GOWNS) ×2 IMPLANT
IV LACTATED RINGER IRRG 3000ML (IV SOLUTION) ×2
IV LR IRRIG 3000ML ARTHROMATIC (IV SOLUTION) IMPLANT
LEGGING LITHOTOMY PAIR STRL (DRAPES) ×2 IMPLANT
NDL SPNL 22GX3.5 QUINCKE BK (NEEDLE) ×1 IMPLANT
NEEDLE SPNL 22GX3.5 QUINCKE BK (NEEDLE) ×2 IMPLANT
PACK BASIN DAY SURGERY FS (CUSTOM PROCEDURE TRAY) ×2 IMPLANT
PAD OB MATERNITY 4.3X12.25 (PERSONAL CARE ITEMS) ×2 IMPLANT
PAD PREP 24X48 CUFFED NSTRL (MISCELLANEOUS) ×2 IMPLANT
SYR CONTROL 10ML LL (SYRINGE) ×2 IMPLANT
TOWEL NATURAL 6PK STERILE (DISPOSABLE) ×1 IMPLANT
TOWEL OR 17X24 6PK STRL BLUE (TOWEL DISPOSABLE) ×4 IMPLANT
TRAY DSU PREP LF (CUSTOM PROCEDURE TRAY) ×2 IMPLANT
TUBING HYDROFLEX HYSTEROSCOPY (TUBING) ×2 IMPLANT
WATER STERILE IRR 500ML POUR (IV SOLUTION) ×2 IMPLANT

## 2011-07-16 NOTE — Anesthesia Postprocedure Evaluation (Signed)
Anesthesia Post Note  Patient: Deborah [name]  Procedure(s) Performed: Procedure(s) (LRB): DILATATION & CURETTAGE/HYSTEROSCOPY WITH NOVASURE ABLATION (N/A)  Anesthesia type: General  Patient location: PACU  Post pain: Pain level controlled  Post assessment: Post-op Vital signs reviewed  Last Vitals:  Filed Vitals:   07/16/11 0828  BP: 111/65  Pulse: 66  Temp: 36.3 C  Resp: 17    Post vital signs: Reviewed  Level of consciousness: sedated  Complications: No apparent anesthesia complications

## 2011-07-16 NOTE — Anesthesia Procedure Notes (Signed)
Procedure Name: LMA Insertion Date/Time: 07/16/2011 7:41 AM Performed by: Jessica Priest Pre-anesthesia Checklist: Patient identified, Emergency Drugs available, Suction available and Patient being monitored Patient Re-evaluated:Patient Re-evaluated prior to inductionOxygen Delivery Method: Circle System Utilized Preoxygenation: Pre-oxygenation with 100% oxygen Intubation Type: IV induction Ventilation: Mask ventilation without difficulty LMA: LMA inserted LMA Size: 4.0 Number of attempts: 1 Airway Equipment and Method: bite block Placement Confirmation: positive ETCO2 Tube secured with: Tape Dental Injury: Teeth and Oropharynx as per pre-operative assessment

## 2011-07-16 NOTE — Discharge Instructions (Signed)
FU office 2-3 weeks for postop appointment.  Call the office [phone] for an appointment. ° °Personal Hygiene: °Use pads not tampons x 1week °You may shower, no tub baths or pools for 2-3 weeks °Wipe from front to back when using restroom ° °Activity: °Do not drive or operate any equipment for 24 hrs.   °Do not rest in bed all day °Walking is encouraged °Walk up and down stairs slowly °You may return to your normal activity in 1-2 days ° °Sexual Activity:  No intercourse for 2 weeks after the procedure. ° °Diet: Eat a light meal as desired this evening.  You may resume your usual diet tomorrow. ° °Return to work:  You may resume your work activities after 1-2 days ° °What to expect:  Expect to have vaginal bleeding/discharge for 2-3 days and spotting for 10-14 days.  It is not unusual to have soreness for 1-2 weeks.  You may have a slight burning sensation when you urinate for the first few days.  You may start your menses in 2-6 weeks.  Mild cramps may continue for a couple of days.   ° °Call your doctor:   °Excessive bleeding, saturating a pad every hour °Inability to urinate 6 hours after discharge °Pain not relieved with pain medications °Fever of 100.4 or greater ° ° °Post Anesthesia Home Care Instructions ° °Activity: °Get plenty of rest for the remainder of the day. A responsible adult should stay with you for 24 hours following the procedure.  °For the next 24 hours, DO NOT: °-Drive a car °-Operate machinery °-Drink alcoholic beverages °-Take any medication unless instructed by your physician °-Make any legal decisions or sign important papers. ° °Meals: °Start with liquid foods such as gelatin or soup. Progress to regular foods as tolerated. Avoid greasy, spicy, heavy foods. If nausea and/or vomiting occur, drink only clear liquids until the nausea and/or vomiting subsides. Call your physician if vomiting continues. ° °Special Instructions/Symptoms: °Your throat may feel dry or sore from the anesthesia or  the breathing tube placed in your throat during surgery. If this causes discomfort, gargle with warm salt water. The discomfort should disappear within 24 hours. ° °

## 2011-07-16 NOTE — Transfer of Care (Signed)
Immediate Anesthesia Transfer of Care Note  Immediate Anesthesia Transfer of Care Note  Patient: Deborah [name]  Procedure(s) Performed: Procedure(s) (LRB): DILATATION & CURETTAGE/HYSTEROSCOPY WITH NOVASURE ABLATION (N/A)  Patient Location: PACU  Anesthesia Type: General  Level of Consciousness: awake, sedated, patient cooperative and responds to stimulation  Airway & Oxygen Therapy: Patient Spontanous Breathing and Patient connected to face mask oxygen  Post-op Assessment: Report given to PACU RN, Post -op Vital signs reviewed and stable and Patient moving all extremities  Post vital signs: Reviewed and stable  Complications: No apparent anesthesia complications

## 2011-07-16 NOTE — Anesthesia Preprocedure Evaluation (Addendum)
Anesthesia Evaluation  Patient identified by MRN, date of birth, ID band Patient awake    Reviewed: Allergy & Precautions, H&P , NPO status , Patient's Chart, lab work & pertinent test results  Airway Mallampati: II TM Distance: >3 FB Neck ROM: Full    Dental No notable dental hx. (+) Teeth Intact and Dental Advisory Given   Pulmonary neg pulmonary ROS,  breath sounds clear to auscultation  Pulmonary exam normal       Cardiovascular negative cardio ROS  Rhythm:Regular Rate:Normal     Neuro/Psych negative neurological ROS  negative psych ROS   GI/Hepatic Neg liver ROS, IBS   Endo/Other  Chronic pancreatitis; partial pancreatectomy/splenectomy  Renal/GU negative Renal ROS  negative genitourinary   Musculoskeletal negative musculoskeletal ROS (+)   Abdominal   Peds negative pediatric ROS (+)  Hematology negative hematology ROS (+)   Anesthesia Other Findings   Reproductive/Obstetrics negative OB ROS                          Anesthesia Physical Anesthesia Plan  ASA: II  Anesthesia Plan: General   Post-op Pain Management:    Induction: Intravenous  Airway Management Planned: LMA  Additional Equipment:   Intra-op Plan:   Post-operative Plan: Extubation in OR  Informed Consent: I have reviewed the patients History and Physical, chart, labs and discussed the procedure including the risks, benefits and alternatives for the proposed anesthesia with the patient or authorized representative who has indicated his/her understanding and acceptance.   Dental advisory given  Plan Discussed with: CRNA  Anesthesia Plan Comments:         Anesthesia Quick Evaluation

## 2011-07-18 NOTE — Op Note (Signed)
Deborah [name], Deborah [name] NO.:  [id number]  MEDICAL RECORD NO.:  [id number]  LOCATION:                               FACILITY:  [facility]  PHYSICIAN:  Zelphia Cairo, MD    DATE OF BIRTH:  01-Mar-1966  DATE OF PROCEDURE:  07/16/2011 DATE OF DISCHARGE:                              OPERATIVE REPORT   PREOPERATIVE DIAGNOSES: 1. Menorrhagia. 2. Dysmenorrhea.  POSTOPERATIVE DIAGNOSES: 1. Menorrhagia. 2. Dysmenorrhea, path pending.  PROCEDURE: 1. Cervical block. 2. Hysteroscopy. 3. Dilatation and curettage. 4. NovaSure ablation.  SURGEON:  Zelphia Cairo, MD  ANESTHESIA:  General.  SPECIMEN:  Endometrial curettings.  COMPLICATIONS:  None.  CONDITION:  Stable to recovery room.  DESCRIPTION OF PROCEDURE:  The patient was taken to the operating room, where general anesthesia was achieved.  She was prepped and draped in sterile fashion.  An in- and out catheter was used to drain her bladder. Bivalve speculum was placed in the vagina and 1 cc of 1% lidocaine was placed on the anterior lip of the cervix.  A single-tooth tenaculum was attached to the anterior lip of the cervix and a cervical block was performed with the remaining 9 cc of lidocaine.  The cervix was then serially dilated using Pratt dilators.  Hysteroscope was inserted and a survey was performed.  She did have some fluffy polypoid type material noted in the cervical os, otherwise the endometrial lining was smooth and appeared normal without masses.  Bilateral ostia were visualized and appeared normal.  Hysteroscope was removed and a gentle curetting was performed.  Specimen was placed on Telfa and passed off to be sent to pathology.  NovaSure device was then inserted and an ablation was performed using standard manufacture guidelines.  Once the cycle was complete, the ablation device was removed.  The tenaculum was removed.  The cervix was hemostatic.  The speculum was removed.  She was extubated  and taken to the recovery room in stable condition.  Sponge, lap, needle, and instrument counts were correct x2.     Zelphia Cairo, MD     GA/MEDQ  D:  07/16/2011  T:  07/17/2011  Job:  [id number]

## 2011-08-09 ENCOUNTER — Other Ambulatory Visit: Payer: Self-pay | Admitting: Gastroenterology

## 2011-08-11 ENCOUNTER — Other Ambulatory Visit: Payer: 59

## 2011-10-18 ENCOUNTER — Telehealth (INDEPENDENT_AMBULATORY_CARE_PROVIDER_SITE_OTHER): Payer: Self-pay

## 2011-10-18 ENCOUNTER — Ambulatory Visit
Admission: RE | Admit: 2011-10-18 | Discharge: 2011-10-18 | Disposition: A | Discharge status: Home / Self Care | Payer: 59 | Source: Ambulatory Visit | Attending: Gastroenterology | Admitting: Gastroenterology

## 2011-10-18 NOTE — Telephone Encounter (Signed)
Called Deborah [name] to let her know that she really needs to get her recommendations from Dr Madilyn Fireman on what test to order next since she is allergic to the dye and he is the one that ordered the test for the Deborah [name] to see Dr Jamey Ripa on 8/23. The Deborah [name] can not tolerate the contrast either she said she vomits the contrast up even the contrast at the facility. I adv. Deborah [name] that she will need to discuss this with Dr Madilyn Fireman b/c the Deborah [name] is coming in for eval of adhesions and more than likely she will need to have a CT scan to prove adhesions or a hernia. The Deborah [name] said she feels a ripping sensation everytime she coughs,sneezes.or laughs. The Deborah [name] did say she thought she had a CT scan this year with [facility] but I can't pull the xray up to review. The Deborah [name] will go to [facility] to sign a release to send the xrays w/report to Dr Jamey Ripa. The Deborah [name] will f/u with Dr Madilyn Fireman to see what he recommends as far as another test to be done before she see's Dr Jamey Ripa but the Deborah [name] wanted [place] to know what was going on in case if Dr Jamey Ripa could recommend any test to do in the meantime.

## 2011-10-18 NOTE — Telephone Encounter (Signed)
Message copied by Ethlyn Gallery on Mon Oct 18, 2011 11:33 AM ------      Message from: Marin Shutter      Created: Mon Oct 18, 2011 10:36 AM      Regarding: Dr Gregor Hams: [phone]       Pt has an appt with Dr Jamey Ripa on 8/23 and a CT scan on 7/31 (Dr. Madilyn Fireman). She is alergic to the dye and vomits the contrast.  She would like an alternative. Also, she has to take laxatives to have bm.

## 2011-10-19 ENCOUNTER — Other Ambulatory Visit: Payer: Self-pay | Admitting: Gastroenterology

## 2011-10-19 DIAGNOSIS — R1012 Left upper quadrant pain: Secondary | ICD-10-CM

## 2011-10-20 ENCOUNTER — Other Ambulatory Visit: Payer: 59

## 2011-10-22 ENCOUNTER — Ambulatory Visit
Admission: RE | Admit: 2011-10-22 | Discharge: 2011-10-22 | Disposition: A | Discharge status: Home / Self Care | Payer: 59 | Source: Ambulatory Visit | Attending: Gastroenterology | Admitting: Gastroenterology

## 2011-10-22 DIAGNOSIS — R1012 Left upper quadrant pain: Secondary | ICD-10-CM

## 2011-11-12 ENCOUNTER — Ambulatory Visit (INDEPENDENT_AMBULATORY_CARE_PROVIDER_SITE_OTHER): Payer: 59 | Admitting: Surgery

## 2011-11-12 ENCOUNTER — Encounter (INDEPENDENT_AMBULATORY_CARE_PROVIDER_SITE_OTHER): Payer: Self-pay | Admitting: Surgery

## 2011-11-12 VITALS — BP 110/64 | HR 62 | Temp 98.4°F | Ht 63.0 in | Wt 140.8 lb

## 2011-11-12 DIAGNOSIS — R1011 Right upper quadrant pain: Secondary | ICD-10-CM

## 2011-11-12 DIAGNOSIS — G8929 Other chronic pain: Secondary | ICD-10-CM | POA: Insufficient documentation

## 2011-11-12 NOTE — Progress Notes (Signed)
NAME: Deborah [name] DOB: [birth year] MRN: [id number]                                                                                      DATE: 11/12/2011  PCP: Gretel Acre, MD Referring Provider: Barrie Folk, MD  IMPRESSION:  Right upper quadrant pain of uncertain etiology  PLAN:   we will get a hepatobiliary scan with  Ejection fractionto see if she is having some gallbladder dysfunction. I discussed with her the potential of doing a laparoscopy to see if some of her pain to be related to some incisional adhesions. I did discuss her surgery from 1997 and explained that she did not have any of her pancreas removed. She had apparently been told that but I reviewed the original operative note and the pathology report in her pancreas was left intact at the time of her splenectomy.                 CC:  Chief Complaint  Patient presents with  . Abdominal Pain    Eval adhesions    HPI:  Deborah [name] is a 46 y.o.  female who presents for evaluation of right upper quadrant intermittent abdominal pain. This is located just below a right upper quadrant oblique incision from her prior splenectomy. She's had chronic left upper quadrant pain basically since her surgery many years ago. However this right upper quadrant pain is relatively new. It is worse with eating. She feels like something is tearing or pulling and she points to a specific location about a centimeter below the incision. It seems to be persistent. She's also had problems with GI symptoms and constipation and has had multiple GI workups in the past. She comes in to see me today to see if I have any other ideas we think this might possibly related to adhesions from her prior surgery.she has done a lot of reading on the subject and knows that we can seldom help patients with chronic abdominal pain by lysing adhesions.  PMH:  has a past medical history of IBS (irritable bowel syndrome); Chronic constipation; Chronic  pancreatitis (LOW-GRADE); Genital herpes in women; Menorrhagia; Perimenopausal; Hot flashes; and Seasonal allergies.  PSH:   has past surgical history that includes Knee arthroscopy (2004); Appendectomy (5/21/197); and Splenectomy, total (08/10/1995).  ALLERGIES:   Allergies  Allergen Reactions  . Ivp Dye (Iodinated Diagnostic Agents) Swelling    Eyes swell  . Ortho Tri-Cyclen (Norgestimate-Eth Estradiol) Hives  . Penicillins Hives    MEDICATIONS: Current outpatient prescriptions:Casanthranol-Docusate Sodium 30-100 MG CAPS, Take 1 capsule by mouth daily., Disp: , Rfl: ;  Pancrelipase, Lip-Prot-Amyl, 5000 UNITS CPEP, Take 2 capsules by mouth 3 (three) times daily with meals., Disp: , Rfl: ;  ValACYclovir HCl (VALTREX PO), Take 1 tablet by mouth daily., Disp: , Rfl: ;  fexofenadine-pseudoephedrine (ALLEGRA-D 24) 180-240 MG per 24 hr tablet, Take 1 tablet by mouth daily., Disp: , Rfl:   ROS: She has filled out our 12 point review of systems and it is negative except for issues with abdominal distention abdominal pain constipation and headaches.Marland Kitchen EXAM:   VITAL SIGNS: BP 110/64  Pulse  62  Temp 98.4 F (36.9 C) (Temporal)  Ht 5\' 3"  (1.6 m)  Wt 140 lb 12.8 oz (63.866 kg)  BMI 24.94 kg/m2 GENERAL:  The patient is alert, oriented, and generally healthy-appearing, NAD. Mood and affect are normal.  HEENT:  The head is normocephalic, the eyes nonicteric, the pupils were round regular and equal. EOMs are normal. Pharynx normal. Dentition good.  NECK:  The neck is supple and there are no masses or thyromegaly.  LUNGS: Normal respirations and clear to auscultation.  HEART: Regular rhythm, with no murmurs rubs or gallops. Pulses are intact carotid dorsalis pedis and posterior tibial. No significant varicosities are noted.    ABDOMEN: Soft, flat, and nontender. No masses or organomegaly is noted. No hernias are noted. Bowel sounds are normal.she has a bucket-handle incision for her prior  surgery. She is somewhat tender just below the right side of his incision.  EXTREMITIES:  Good range of motion, no edema.  DATA REVIEWED:  I have reviewed my old office records which are fairly extensive, and multiple notes in Epic dating back for many years as well as notes from Dr. Madilyn Fireman her gastroenterologist.   NAME: Deborah [name] DOB: 1966/03/09 MRN: [id number]                                                                                      DATE: 11/12/2011                 Velena Keegan J 11/12/2011  CC: Barrie Folk, MD, Gretel Acre, MD

## 2011-11-12 NOTE — Patient Instructions (Signed)
We will schedule some more tests about her gallbladder and then consider whether it would be appropriate to do some laparoscopy for your abdominal pain

## 2011-11-24 ENCOUNTER — Telehealth (INDEPENDENT_AMBULATORY_CARE_PROVIDER_SITE_OTHER): Payer: Self-pay | Admitting: General Surgery

## 2011-11-24 ENCOUNTER — Encounter (HOSPITAL_COMMUNITY)
Admission: RE | Admit: 2011-11-24 | Discharge: 2011-11-24 | Disposition: A | Discharge status: Home / Self Care | Payer: 59 | Source: Ambulatory Visit | Attending: Surgery | Admitting: Surgery

## 2011-11-24 DIAGNOSIS — R1011 Right upper quadrant pain: Secondary | ICD-10-CM | POA: Insufficient documentation

## 2011-11-24 MED ORDER — SINCALIDE 5 MCG IJ SOLR
0.0200 ug/kg | Freq: Once | INTRAMUSCULAR | Status: DC
  Administered 2011-11-24: 1.3 ug via INTRAVENOUS

## 2011-11-24 MED ORDER — TECHNETIUM TC 99M MEBROFENIN IV KIT
5.0000 | PACK | Freq: Once | INTRAVENOUS | Status: AC | PRN
  Administered 2011-11-24: 5 via INTRAVENOUS

## 2011-11-24 NOTE — Telephone Encounter (Signed)
Patient aware HIDA scan normal. She will follow up with GI.

## 2011-11-24 NOTE — Telephone Encounter (Signed)
Message copied by Liliana Cline on Wed Nov 24, 2011  1:38 PM ------      Message from: Currie Paris      Created: Wed Nov 24, 2011  1:30 PM       Let her know her gallbladder is OK

## 2011-12-03 ENCOUNTER — Encounter (INDEPENDENT_AMBULATORY_CARE_PROVIDER_SITE_OTHER): Payer: Self-pay | Admitting: Surgery

## 2011-12-03 ENCOUNTER — Ambulatory Visit (INDEPENDENT_AMBULATORY_CARE_PROVIDER_SITE_OTHER): Payer: 59 | Admitting: Surgery

## 2011-12-03 VITALS — BP 114/76 | HR 68 | Temp 97.8°F | Resp 14 | Ht 63.0 in | Wt 140.8 lb

## 2011-12-03 DIAGNOSIS — R1011 Right upper quadrant pain: Secondary | ICD-10-CM

## 2011-12-03 DIAGNOSIS — G8929 Other chronic pain: Secondary | ICD-10-CM

## 2011-12-03 NOTE — Patient Instructions (Signed)
We will try to arrange an opinion from the gastroenterologists at [facility] to see if they have some suggestions for management of your pain

## 2011-12-03 NOTE — Progress Notes (Signed)
Chief complaint: Chronic abdominal pain  History of present illness: I saw this patient several weeks ago because of her ongoing issues with abdominal pain. Since I saw her we got a hepatobiliary scan which was basically normal including a normal ejection fraction. She has modified her diet limiting milk products as well as gluten. She notes that the burning pain that she used to have across her entire upper abdomen is improved since changing her diet. However, the other component of her pain, which is more localized to the scar from her prior surgery, has continued. She describes it as sometimes a tearing or burning sensation. It is not particularly improved.  Past history family history review of systems have been documented previously and reviewed, they are not in this note.  Exam: Vital signs:BP 114/76  Pulse 68  Temp 97.8 F (36.6 C) (Temporal)  Resp 14  Ht 5\' 3"  (1.6 m)  Wt 140 lb 12.8 oz (63.866 kg)  BMI 24.94 kg/m2 General: The patient alert oriented healthy-appearing, and NAD. Abdomen: She has a well-healed transverse surgical scar from her prior surgery. There is no evidence of hernia. She is nontender today. There are no masses or organomegaly.  Data reviewed: Hepatobiliary scan as noted above.  Impression: Chronic upper abdominal pain more right than left uncertain etiology.  Plan: I did discuss with her the possibility of a laparoscopy to see if there are some adhesions to the anterior abdominal wall from her surgery many years ago. I told her that as a general rule we do not like to operate on people for lesions because they don't seem to improve the patient's or find many problems that could be fixed. However, since her symptoms seem so a localized this might be something we could try.  An additional of her problems have been chronic and she seem at least 2 different gastroenterologists here in [place]. Since she has had some improvement in her symptoms with dietary  modification there may be some other GI issues causing her discomfort there are nonsurgical. After lengthy discussion we agreed that another opinion from an independent gastroenterologists would be appropriate. She [facility] so we will try to arrange that for her.

## 2011-12-08 ENCOUNTER — Telehealth (INDEPENDENT_AMBULATORY_CARE_PROVIDER_SITE_OTHER): Payer: Self-pay | Admitting: General Surgery

## 2011-12-08 DIAGNOSIS — G8929 Other chronic pain: Secondary | ICD-10-CM

## 2011-12-08 DIAGNOSIS — R1084 Generalized abdominal pain: Secondary | ICD-10-CM

## 2011-12-08 NOTE — Telephone Encounter (Signed)
Referral faxed to [facility]. Awaiting appt.

## 2011-12-08 NOTE — Telephone Encounter (Signed)
Message copied by Liliana Cline on Wed Dec 08, 2011  8:13 AM ------      Message from: Currie Paris      Created: Tue Dec 07, 2011  6:16 PM       She may have had a name, else, any GI      ----- Message -----         From: Liliana Cline, CMA         Sent: 12/06/2011   2:26 PM           To: Currie Paris, MD            Were you looking into a specific MD at [facility] or do you just want me to refer her to GI at [facility]?

## 2011-12-10 ENCOUNTER — Telehealth (INDEPENDENT_AMBULATORY_CARE_PROVIDER_SITE_OTHER): Payer: Self-pay | Admitting: General Surgery

## 2011-12-10 NOTE — Telephone Encounter (Signed)
Left message on machine for patient to call back and ask for me. Appt made with [facility] for evaluation of ongoing chronic abdominal pain. Our records faxed. I made first available appt with Dr Boyd Kerbs on Tuesday 02/15/12 @ 8:30 am. He is located at [facility] and they will send her a new patient packet. She needs to release her GI records to Dr Boyd Kerbs. Awaiting patient's call.

## 2011-12-14 NOTE — Telephone Encounter (Signed)
Patient is aware and will keep appt with Dr Boyd Kerbs and get GI records sent to him. Will call if needed.

## 2012-02-15 DIAGNOSIS — K5909 Other constipation: Secondary | ICD-10-CM | POA: Insufficient documentation

## 2012-03-08 ENCOUNTER — Encounter (INDEPENDENT_AMBULATORY_CARE_PROVIDER_SITE_OTHER): Payer: Self-pay

## 2012-03-22 HISTORY — PX: ABLATION: SHX5711

## 2013-07-30 ENCOUNTER — Encounter (HOSPITAL_COMMUNITY): Payer: Self-pay | Admitting: Emergency Medicine

## 2013-07-30 ENCOUNTER — Emergency Department (HOSPITAL_COMMUNITY)
Admission: EM | Admit: 2013-07-30 | Discharge: 2013-07-31 | Disposition: A | Discharge status: Home / Self Care | Payer: 59 | Attending: Emergency Medicine | Admitting: Emergency Medicine

## 2013-07-30 ENCOUNTER — Emergency Department (HOSPITAL_COMMUNITY)
Admission: EM | Admit: 2013-07-30 | Discharge: 2013-07-30 | Disposition: A | Discharge status: Home / Self Care | Payer: 59 | Source: Home / Self Care | Attending: Family Medicine | Admitting: Family Medicine

## 2013-07-30 ENCOUNTER — Emergency Department (HOSPITAL_COMMUNITY): Payer: 59

## 2013-07-30 DIAGNOSIS — G8929 Other chronic pain: Secondary | ICD-10-CM

## 2013-07-30 DIAGNOSIS — Z880 Allergy status to penicillin: Secondary | ICD-10-CM | POA: Insufficient documentation

## 2013-07-30 DIAGNOSIS — K5900 Constipation, unspecified: Secondary | ICD-10-CM

## 2013-07-30 DIAGNOSIS — IMO0002 Reserved for concepts with insufficient information to code with codable children: Secondary | ICD-10-CM | POA: Insufficient documentation

## 2013-07-30 DIAGNOSIS — R141 Gas pain: Secondary | ICD-10-CM | POA: Insufficient documentation

## 2013-07-30 DIAGNOSIS — Z8742 Personal history of other diseases of the female genital tract: Secondary | ICD-10-CM | POA: Insufficient documentation

## 2013-07-30 DIAGNOSIS — Z79899 Other long term (current) drug therapy: Secondary | ICD-10-CM | POA: Insufficient documentation

## 2013-07-30 DIAGNOSIS — R143 Flatulence: Secondary | ICD-10-CM

## 2013-07-30 DIAGNOSIS — R142 Eructation: Secondary | ICD-10-CM

## 2013-07-30 DIAGNOSIS — Z8619 Personal history of other infectious and parasitic diseases: Secondary | ICD-10-CM | POA: Insufficient documentation

## 2013-07-30 DIAGNOSIS — M549 Dorsalgia, unspecified: Secondary | ICD-10-CM | POA: Insufficient documentation

## 2013-07-30 DIAGNOSIS — Z8709 Personal history of other diseases of the respiratory system: Secondary | ICD-10-CM | POA: Insufficient documentation

## 2013-07-30 DIAGNOSIS — R1011 Right upper quadrant pain: Secondary | ICD-10-CM

## 2013-07-30 LAB — COMPREHENSIVE METABOLIC PANEL
ALBUMIN: 4 g/dL (ref 3.5–5.2)
ALT: 17 U/L (ref 0–35)
AST: 21 U/L (ref 0–37)
Alkaline Phosphatase: 86 U/L (ref 39–117)
BUN: 11 mg/dL (ref 6–23)
CALCIUM: 9.3 mg/dL (ref 8.4–10.5)
CO2: 26 meq/L (ref 19–32)
CREATININE: 0.63 mg/dL (ref 0.50–1.10)
Chloride: 100 mEq/L (ref 96–112)
GFR calc Af Amer: >90 mL/min (ref >90)
Glucose, Bld: 104 mg/dL — ABNORMAL HIGH (ref 70–99)
Potassium: 3.7 mEq/L (ref 3.7–5.3)
Sodium: 138 mEq/L (ref 137–147)
Total Bilirubin: 0.3 mg/dL (ref 0.3–1.2)
Total Protein: 8.2 g/dL (ref 6.0–8.3)

## 2013-07-30 LAB — CBC WITH DIFFERENTIAL/PLATELET
BASOS ABS: 0.1 10*3/uL (ref 0.0–0.1)
BASOS PCT: 1 % (ref 0–1)
EOS ABS: 0.4 10*3/uL (ref 0.0–0.7)
EOS PCT: 4 % (ref 0–5)
HEMATOCRIT: 42.1 % (ref 36.0–46.0)
Hemoglobin: 14.8 g/dL (ref 12.0–15.0)
Lymphocytes Relative: 34 % (ref 12–46)
Lymphs Abs: 3.6 10*3/uL (ref 0.7–4.0)
MCH: 34.4 pg — AB (ref 26.0–34.0)
MCHC: 35.2 g/dL (ref 30.0–36.0)
MCV: 97.9 fL (ref 78.0–100.0)
MONO ABS: 0.7 10*3/uL (ref 0.1–1.0)
Monocytes Relative: 6 % (ref 3–12)
Neutro Abs: 5.8 10*3/uL (ref 1.7–7.7)
Neutrophils Relative %: 55 % (ref 43–77)
Platelets: 571 10*3/uL — ABNORMAL HIGH (ref 150–400)
RBC: 4.3 MIL/uL (ref 3.87–5.11)
RDW: 13.8 % (ref 11.5–15.5)
WBC: 10.5 10*3/uL (ref 4.0–10.5)

## 2013-07-30 LAB — LIPASE, BLOOD: Lipase: 31 U/L (ref 11–59)

## 2013-07-30 MED ORDER — FENTANYL CITRATE 0.05 MG/ML IJ SOLN
50.0000 ug | Freq: Once | INTRAMUSCULAR | Status: AC
  Administered 2013-07-30: 50 ug via INTRAVENOUS
  Filled 2013-07-30: qty 2

## 2013-07-30 NOTE — ED Provider Notes (Signed)
CSN: [id number]     Arrival date & time 07/30/13  1645 History   First MD Initiated Contact with Patient 07/30/13 2209     Chief Complaint  Patient presents with  . Abdominal Pain  . Constipation     (Consider location/radiation/quality/duration/timing/severity/associated sxs/prior Treatment) HPI Comments: 48 yo female and history or constipation presented today to the urgent care clinic for 3 weeks without a bowel movement. She has tried MiraLAX and all of her laxative therapies that have worked for her in the past, but none of these are relieving her symptoms. Over the last 3 weeks she has experienced increasing diffuse abdominal pain. Last night she felt a bit feverish. The urgent care physician arrange transport for her to the emergency department out of concern for small bowel obstruction.     Patient is a 48 y.o. female presenting with abdominal pain and constipation. The history is provided by the patient.  Abdominal Pain Associated symptoms: constipation   Associated symptoms: no chest pain, no chills, no dysuria, no fever, no shortness of breath and no vomiting   Constipation Associated symptoms: abdominal pain and back pain   Associated symptoms: no dysuria, no fever and no vomiting     Past Medical History  Diagnosis Date  . IBS (irritable bowel syndrome)   . Chronic constipation   . Chronic pancreatitis LOW-GRADE    FOLLOWED BY DR Sydell Axon BRODIE  . Genital herpes in women   . Menorrhagia   . Perimenopausal   . Hot flashes   . Seasonal allergies    Past Surgical History  Procedure Laterality Date  . Knee arthroscopy  2004    RIGHT  . Appendectomy  5/21/197    Incidental appy  . Splenectomy, total  08/10/1995    For splwnic cyst - accessory spleen also removed   Family History  Problem Relation Age of Onset  . Cancer Father     prostate cancer  . Cancer Paternal Grandmother     Ovarian cancer   History  Substance Use Topics  . Smoking status: Never Smoker    . Smokeless tobacco: Never Used  . Alcohol Use: No   OB History   Grav Para Term Preterm Abortions TAB SAB Ect Mult Living                 Review of Systems  Constitutional: Positive for appetite change. Negative for fever and chills.  HENT: Negative for congestion.   Eyes: Negative for visual disturbance.  Respiratory: Negative for shortness of breath.   Cardiovascular: Negative for chest pain.  Gastrointestinal: Positive for abdominal pain and constipation. Negative for vomiting.  Genitourinary: Negative for dysuria and flank pain.  Musculoskeletal: Positive for back pain. Negative for neck pain and neck stiffness.  Skin: Negative for rash.  Neurological: Negative for light-headedness and headaches.      Allergies  Ivp dye; Ortho tri-cyclen; and Penicillins  Home Medications   Prior to Admission medications   Medication Sig Start Date End Date Taking? Authorizing Provider  Docusate Calcium (STOOL SOFTENER PO) Take by mouth.   Yes Historical Provider, MD  fexofenadine-pseudoephedrine (ALLEGRA-D 24) 180-240 MG per 24 hr tablet Take 1 tablet by mouth daily.   Yes Historical Provider, MD  fluticasone (FLONASE) 50 MCG/ACT nasal spray Place 1 spray into both nostrils daily.  06/25/13  Yes Historical Provider, MD  LINZESS 145 MCG CAPS capsule Take 145 mcg by mouth daily.  06/29/13  Yes Historical Provider, MD  Methylcellulose, Laxative, (CITRUCEL PO)  Take 15 mLs by mouth every 4 (four) hours as needed (constapation).   Yes Historical Provider, MD  Polyethylene Glycol 3350 (MIRALAX PO) Take by mouth.   Yes Historical Provider, MD  traMADol (ULTRAM) 50 MG tablet Take 50 mg by mouth every 6 (six) hours as needed for moderate pain.   Yes Historical Provider, MD  ValACYclovir HCl (VALTREX PO) Take 1 tablet by mouth daily.   Yes Historical Provider, MD   BP 128/82  Pulse 67  Temp(Src) 98.2 F (36.8 C) (Oral)  Resp 20  SpO2 97% Physical Exam  Nursing note and vitals  reviewed. Constitutional: She is oriented to person, place, and time. She appears well-developed and well-nourished.  HENT:  Head: Normocephalic and atraumatic.  Mild dry mucous membranes  Eyes: Conjunctivae are normal. Right eye exhibits no discharge. Left eye exhibits no discharge.  Neck: Normal range of motion. Neck supple. No tracheal deviation present.  Cardiovascular: Normal rate and regular rhythm.   Pulmonary/Chest: Effort normal and breath sounds normal.  Abdominal: Soft. She exhibits distension (mild). There is tenderness. There is no guarding. Rebound: mild central and epigastric.  Musculoskeletal: She exhibits no edema and no tenderness.  Neurological: She is alert and oriented to person, place, and time.  Skin: Skin is warm. No rash noted.  Psychiatric: She has a normal mood and affect.    ED Course  Procedures (including critical care time) Labs Review Labs Reviewed  CBC WITH DIFFERENTIAL - Abnormal; Notable for the following:    MCH 34.4 (*)    Platelets 571 (*)    All other components within normal limits  COMPREHENSIVE METABOLIC PANEL - Abnormal; Notable for the following:    Glucose, Bld 104 (*)    All other components within normal limits  LIPASE, BLOOD    Imaging Review Dg Abd Acute W/chest  07/31/2013   CLINICAL DATA:  Constipation  EXAM: ACUTE ABDOMEN SERIES (ABDOMEN 2 VIEW & CHEST 1 VIEW)  COMPARISON:  None.  FINDINGS: For and pulmonary vascularity are within normal limits. The lungs are clear bilaterally. Scattered large and small bowel gas is noted. Considerable fecal material is noted throughout the colon. No obstructive changes are noted. Bony structures are within normal limits.  IMPRESSION: Considerable retained fecal material within the colon.   Electronically Signed   By: Inez Catalina M.D.   On: 07/31/2013 00:03     EKG Interpretation None      MDM   Final diagnoses:  None   Patient with extensive constipation/IBS history who has close  outpatient follow presents with worsening symptoms than her normal and no bowel movement in over 2 weeks. Patient has had surgeries in the past but no bowel structure history. X-ray showed diffuse stool in no acute obstruction however with persistent symptoms plan for CT scan without IV contrast due to IVP dye allergy to confirm no signs of bowel obstruction. Patient denies wanting pain medicines at this time CT scan pending. Signed out with plan a followup CT scan and disposition.  Abdominal pain, constipation  Mariea Clonts, MD 07/31/13 0221

## 2013-07-30 NOTE — ED Notes (Signed)
At bedside for physician exam.

## 2013-07-30 NOTE — ED Notes (Signed)
Pt states known history of constipation with multiple OTC medications and previous visits. Pt states that last night she felt very full and slightly nauseated. Pt also states that she has been having pain when she does go, pressure and like something is splitting open.

## 2013-07-30 NOTE — ED Notes (Addendum)
Per pt sts upper abdominal pain mostly LUQ. sts she hasn't had a decent BM in a few weeks. Denies diarrhea. sts some nausea and pain with eating. Per pt sts she has tried multiple medications to have a BM without any relief.

## 2013-07-30 NOTE — ED Notes (Signed)
Patient ambulated to and from restroom without difficulty. Currently changing in to gown.

## 2013-07-30 NOTE — ED Provider Notes (Signed)
Deborah [name] is a 48 y.o. female who presents to Urgent Care today for constipation or abdominal pain. Patient has had 3 weeks of worsening abdominal pain and inability to have a bowel movement. Her last bowel movement was approximately 3 weeks ago. She has tried castor oral MiraLAX and suppositories with no effect. She notes history of multiple abdominal surgeries and is concerned she may have developed a small bowel obstruction. She denies any significant vomiting. She stopped eating because of her symptoms over the last several days.   Past Medical History  Diagnosis Date  . IBS (irritable bowel syndrome)   . Chronic constipation   . Chronic pancreatitis LOW-GRADE    FOLLOWED BY DR Sydell Axon BRODIE  . Genital herpes in women   . Menorrhagia   . Perimenopausal   . Hot flashes   . Seasonal allergies    History  Substance Use Topics  . Smoking status: Never Smoker   . Smokeless tobacco: Never Used  . Alcohol Use: No   ROS as above Medications: No current facility-administered medications for this encounter.   Current Outpatient Prescriptions  Medication Sig Dispense Refill  . Docusate Calcium (STOOL SOFTENER PO) Take by mouth.      . Pancrelipase, Lip-Prot-Amyl, 5000 UNITS CPEP Take 2 capsules by mouth 3 (three) times daily with meals.      . ValACYclovir HCl (VALTREX PO) Take 1 tablet by mouth daily.        Exam:  BP 124/77  Pulse 72  Temp(Src) 98.2 F (36.8 C) (Oral)  Resp 16  SpO2 98% Gen: Well NAD HEENT: EOMI,  MMM Lungs: Normal work of breathing. CTABL Heart: RRR no MRG Abd: NABS, Soft. Distended and mildly tender with no rebound or guarding. Exts: Brisk capillary refill, warm and well perfused.  Rectal exam: No stool in the vault  No results found for this or any previous visit (from the past 24 hour(s)). No results found.  Assessment and Plan: 48 y.o. female with abdominal pain and inability to have a bowel movement. This is concerning for small bowel  obstruction given her history and physical exam. Discussed options with patient. Transfer to the emergency room for evaluation and management.  Discussed warning signs or symptoms. Please see discharge instructions. Patient expresses understanding.    Gregor Hams, MD 07/30/13 [phone]

## 2013-07-30 NOTE — ED Notes (Signed)
Patient complains of "blocked bowels"

## 2013-07-31 ENCOUNTER — Emergency Department (HOSPITAL_COMMUNITY): Payer: 59

## 2013-07-31 NOTE — Discharge Instructions (Signed)
Constipation, Adult Constipation is when a person:  Poops (bowel movement) less than 3 times a week.  Has a hard time pooping.  Has poop that is dry, hard, or bigger than normal. HOME CARE   Eat more fiber, such as fruits, vegetables, whole grains like brown rice, and beans.  Eat less fatty foods and sugar. This includes [place] fries, hamburgers, cookies, candy, and soda.  If you are not getting enough fiber from food, take products with added fiber in them (supplements).  Drink enough fluid to keep your pee (urine) clear or pale yellow.  Go to the restroom when you feel like you need to poop. Do not hold it.  Only take medicine as told by your doctor. Do not take medicines that help you poop (laxatives) without talking to your doctor first.  Exercise on a regular basis, or as told by your doctor. GET HELP RIGHT AWAY IF:   You have bright red blood in your poop (stool).  Your constipation lasts more than 4 days or gets worse.  You have belly (abdomen) or butt (rectal) pain.  You have thin poop (as thin as a pencil).  You lose weight, and it cannot be explained. MAKE SURE YOU:   Understand these instructions.  Will watch your condition.  Will get help right away if you are not doing well or get worse. Document Released: 08/25/2007 Document Revised: 05/31/2011 Document Reviewed: 12/18/2012 [facility] Patient Information 2014 [place], [place].   Return for any new or worse symptoms. Make a plan but followup with your GI Dr. Jeanmarie Plant. Continue MiraLAX stool softeners and fiber. Lab results and CT scan results provided to you today.

## 2013-07-31 NOTE — ED Notes (Addendum)
Pt attempted to drink CT drink. Pt got 1/3 of first bottle down. CT aware.

## 2013-10-08 ENCOUNTER — Ambulatory Visit
Admission: RE | Admit: 2013-10-08 | Discharge: 2013-10-08 | Disposition: A | Discharge status: Home / Self Care | Payer: 59 | Source: Ambulatory Visit | Attending: Family Medicine | Admitting: Family Medicine

## 2013-10-08 ENCOUNTER — Other Ambulatory Visit: Payer: Self-pay | Admitting: Family Medicine

## 2013-10-08 DIAGNOSIS — R0789 Other chest pain: Secondary | ICD-10-CM

## 2013-10-08 DIAGNOSIS — M25512 Pain in left shoulder: Secondary | ICD-10-CM

## 2013-10-10 ENCOUNTER — Other Ambulatory Visit: Payer: Self-pay | Admitting: Family Medicine

## 2013-10-10 DIAGNOSIS — M25512 Pain in left shoulder: Secondary | ICD-10-CM

## 2013-10-18 ENCOUNTER — Encounter (INDEPENDENT_AMBULATORY_CARE_PROVIDER_SITE_OTHER): Payer: Self-pay

## 2013-10-18 ENCOUNTER — Ambulatory Visit
Admission: RE | Admit: 2013-10-18 | Discharge: 2013-10-18 | Disposition: A | Discharge status: Home / Self Care | Payer: 59 | Source: Ambulatory Visit | Attending: Family Medicine | Admitting: Family Medicine

## 2013-10-18 DIAGNOSIS — M25512 Pain in left shoulder: Secondary | ICD-10-CM

## 2015-04-30 IMAGING — CT CT ABD-PELV W/O CM
2 of 4 series · 16 of 46 positions shown, 18 images · non-contrast
Comparison: DG ABD ACUTE W/CHEST dated 07/30/2013; US ABDOMEN
COMPLETE dated 10/22/2011; CT ABD/PELV WO CM dated 12/16/2009

CLINICAL DATA: Constipation and abdominal pain for 3 weeks.

EXAM:
CT ABDOMEN AND PELVIS WITHOUT CONTRAST
TECHNIQUE: Multidetector CT imaging of the abdomen and pelvis was performed
following the standard protocol without IV contrast.

[Series 2: abd/ pelvis 5.0 i30f 1 · axial · 0.80mm/px · z∈[+854,+1229]mm · 13 of 83 slices shown, 15 images]
[im 4/83  soft-tissue]
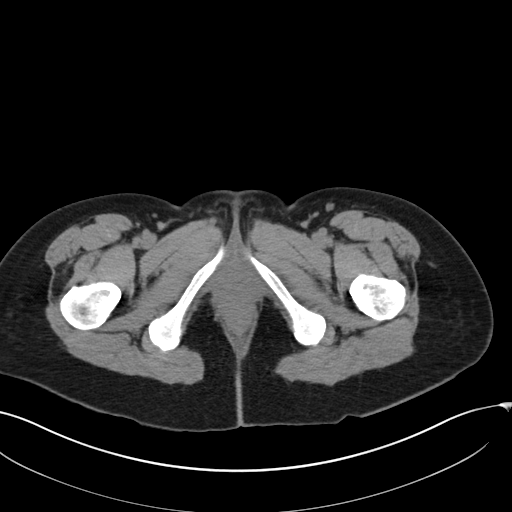
[im 4/83  bone]
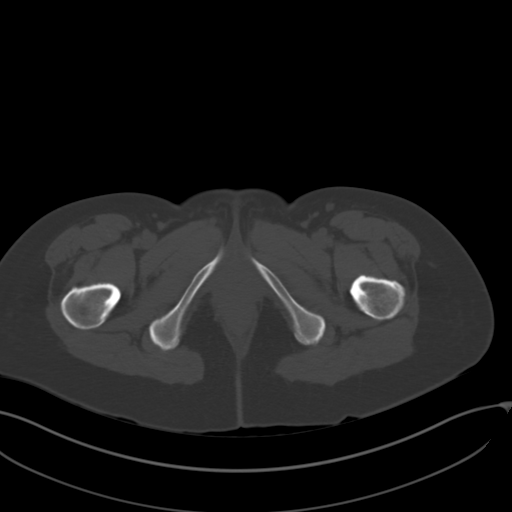
[im 10/83  soft-tissue]
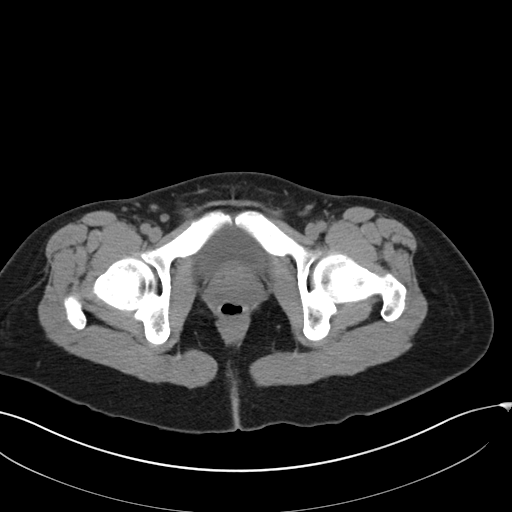
[im 17/83  soft-tissue]
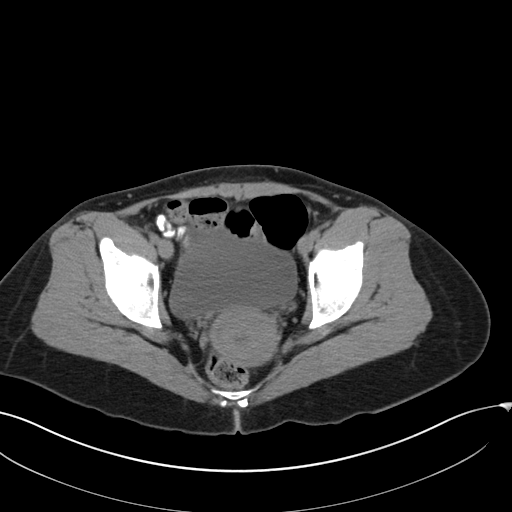
[im 23/83  soft-tissue]
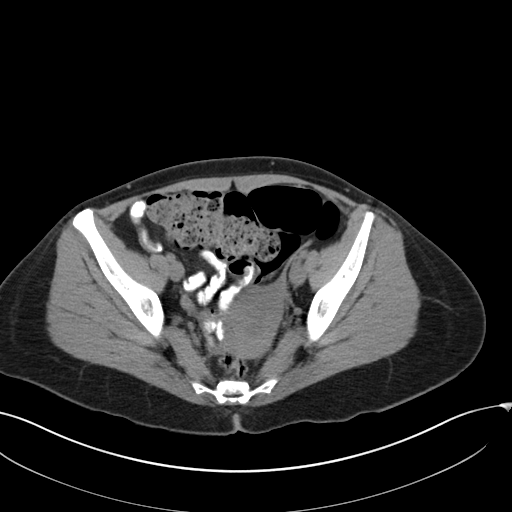
[im 30/83  soft-tissue]
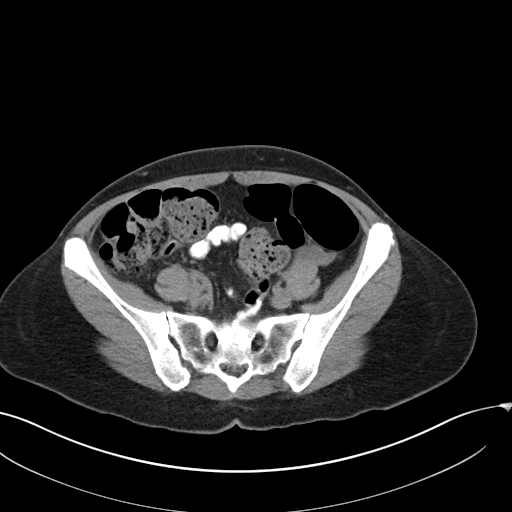
[im 37/83  soft-tissue]
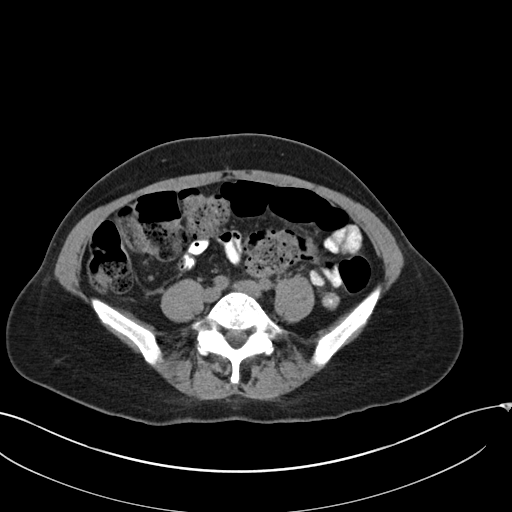
[im 43/83  soft-tissue]
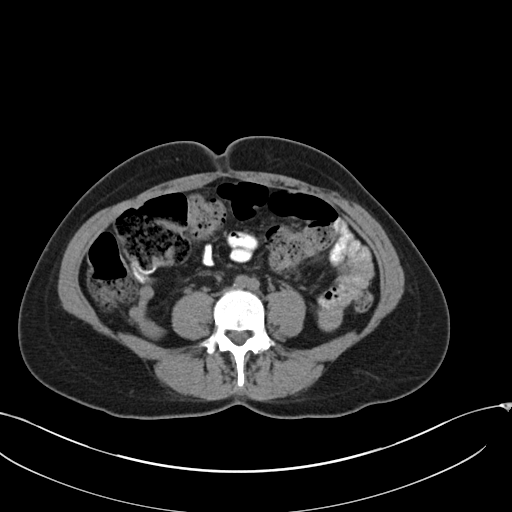
[im 46/83  soft-tissue]
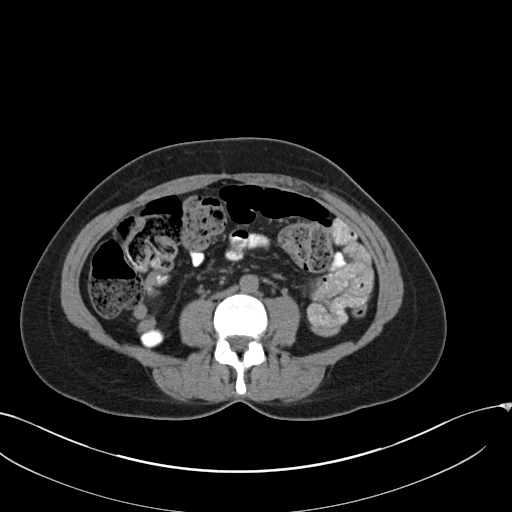
[im 53/83  soft-tissue]
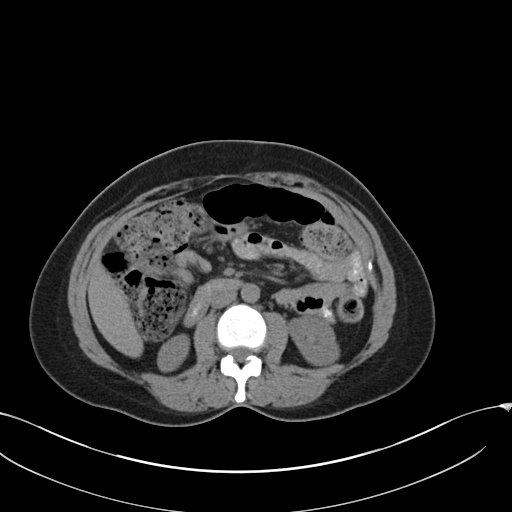
[im 53/83  bone]
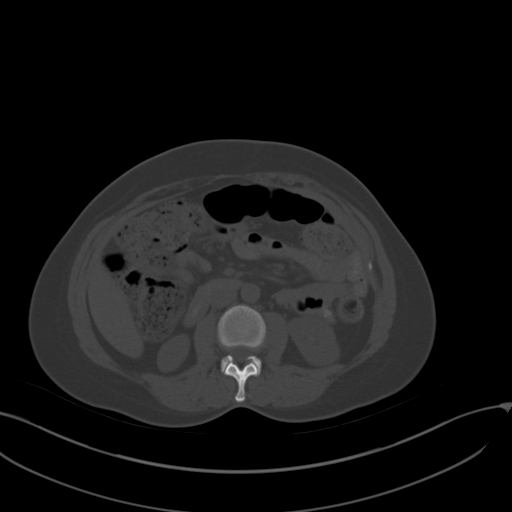
[im 60/83  soft-tissue]
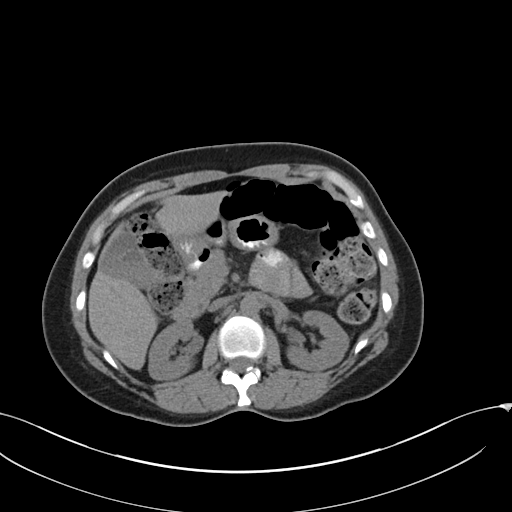
[im 66/83  soft-tissue]
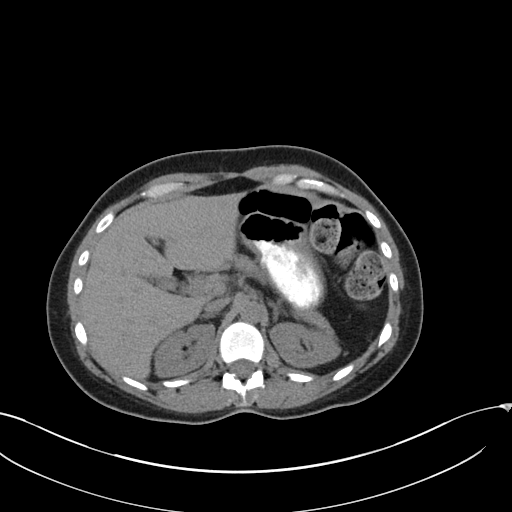
[im 73/83  soft-tissue]
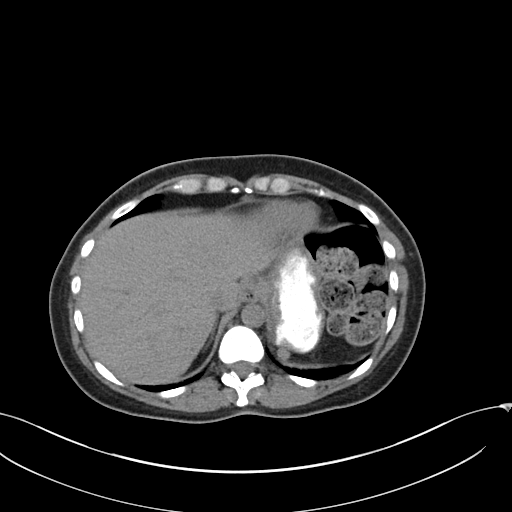
[im 79/83  soft-tissue]
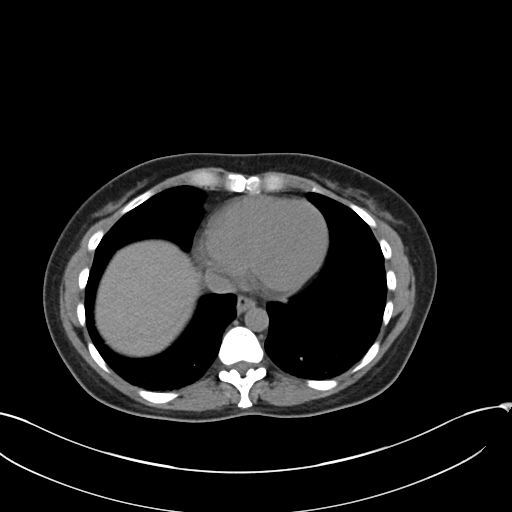

[Series 5: coronals · coronal · 0.76mm/px · 3 of 103 slices shown]
[im 35/103  soft-tissue]
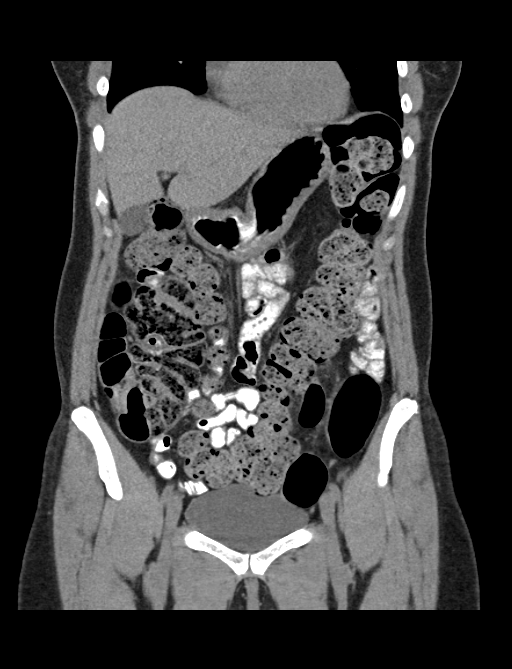
[im 46/103  soft-tissue]
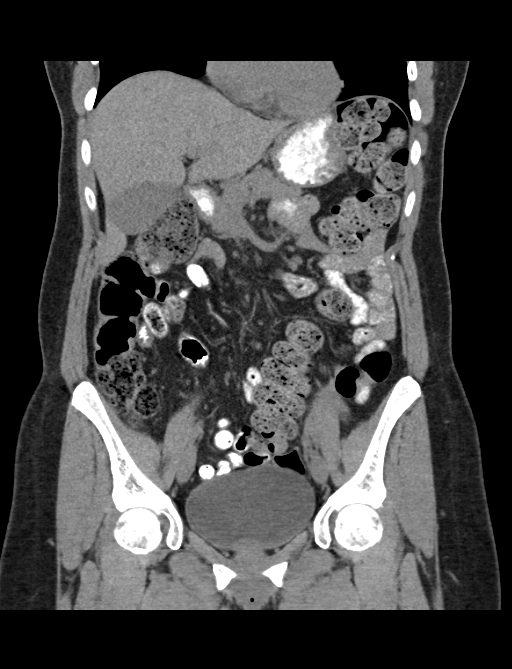
[im 57/103  soft-tissue]
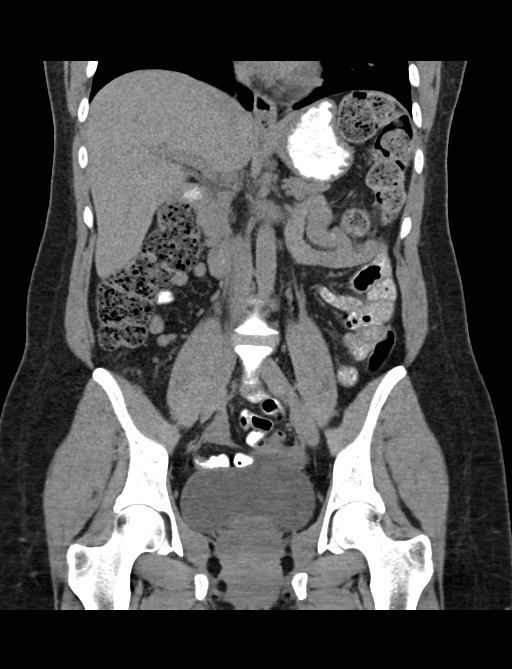

[16 of 46 positions shown; findings below may reference images not displayed]

FINDINGS: Dependent atelectasis in the lung bases.

The unenhanced appearance of the liver, gallbladder, pancreas,
adrenal glands, kidneys, abdominal aorta, inferior vena cava, and
retroperitoneal lymph nodes is unremarkable. There appears to be
surgical absence of the spleen with small accessory spleens present.
The stomach and small bowel are decompressed. The colon is diffusely
stool-filled without significant distention consistent with
constipation. No free air or free fluid in the abdomen.

Pelvis: Uterus and ovaries are not enlarged. No bladder wall
thickening. No free or loculated pelvic fluid collections. Appendix
is not visualized. No evidence of diverticulitis. No pelvic mass or
lymphadenopathy. No destructive bone lesions appreciated.
IMPRESSION: Diffusely stool-filled colon compatible with constipation. No
evidence of obstruction or focal inflammatory process.

## 2015-12-23 ENCOUNTER — Encounter (HOSPITAL_COMMUNITY): Payer: Self-pay | Admitting: Emergency Medicine

## 2015-12-23 ENCOUNTER — Emergency Department (HOSPITAL_COMMUNITY): Payer: Commercial Managed Care - HMO

## 2015-12-23 ENCOUNTER — Emergency Department (HOSPITAL_COMMUNITY)
Admission: EM | Admit: 2015-12-23 | Discharge: 2015-12-23 | Disposition: A | Discharge status: Home / Self Care | Payer: Commercial Managed Care - HMO | Attending: Emergency Medicine | Admitting: Emergency Medicine

## 2015-12-23 DIAGNOSIS — K5901 Slow transit constipation: Secondary | ICD-10-CM | POA: Diagnosis not present

## 2015-12-23 DIAGNOSIS — Z79899 Other long term (current) drug therapy: Secondary | ICD-10-CM | POA: Diagnosis not present

## 2015-12-23 DIAGNOSIS — K5900 Constipation, unspecified: Secondary | ICD-10-CM | POA: Diagnosis present

## 2015-12-23 LAB — URINALYSIS, ROUTINE W REFLEX MICROSCOPIC
BILIRUBIN URINE: NEGATIVE
Glucose, UA: NEGATIVE mg/dL
HGB URINE DIPSTICK: NEGATIVE
KETONES UR: 15 mg/dL — AB
Nitrite: NEGATIVE
Protein, ur: NEGATIVE mg/dL
SPECIFIC GRAVITY, URINE: 1.019 (ref 1.005–1.030)
pH: 6 (ref 5.0–8.0)

## 2015-12-23 LAB — COMPREHENSIVE METABOLIC PANEL
ALK PHOS: 67 U/L (ref 38–126)
ALT: 14 U/L (ref 14–54)
AST: 24 U/L (ref 15–41)
Albumin: 4.1 g/dL (ref 3.5–5.0)
Anion gap: 8 (ref 5–15)
BILIRUBIN TOTAL: 0.8 mg/dL (ref 0.3–1.2)
BUN: 14 mg/dL (ref 6–20)
CALCIUM: 9.1 mg/dL (ref 8.9–10.3)
CO2: 23 mmol/L (ref 22–32)
CREATININE: 0.83 mg/dL (ref 0.44–1.00)
Chloride: 108 mmol/L (ref 101–111)
Glucose, Bld: 92 mg/dL (ref 65–99)
Potassium: 4 mmol/L (ref 3.5–5.1)
Sodium: 139 mmol/L (ref 135–145)
TOTAL PROTEIN: 6.9 g/dL (ref 6.5–8.1)

## 2015-12-23 LAB — CBC WITH DIFFERENTIAL/PLATELET
BASOS PCT: 1 %
Basophils Absolute: 0.1 10*3/uL (ref 0.0–0.1)
EOS ABS: 0.2 10*3/uL (ref 0.0–0.7)
EOS PCT: 2 %
HCT: 42.2 % (ref 36.0–46.0)
Hemoglobin: 14 g/dL (ref 12.0–15.0)
LYMPHS ABS: 3.3 10*3/uL (ref 0.7–4.0)
Lymphocytes Relative: 32 %
MCH: 32.8 pg (ref 26.0–34.0)
MCHC: 33.2 g/dL (ref 30.0–36.0)
MCV: 98.8 fL (ref 78.0–100.0)
Monocytes Absolute: 0.6 10*3/uL (ref 0.1–1.0)
Monocytes Relative: 6 %
Neutro Abs: 6.1 10*3/uL (ref 1.7–7.7)
Neutrophils Relative %: 59 %
PLATELETS: 490 10*3/uL — AB (ref 150–400)
RBC: 4.27 MIL/uL (ref 3.87–5.11)
RDW: 13.7 % (ref 11.5–15.5)
WBC: 10.4 10*3/uL (ref 4.0–10.5)

## 2015-12-23 LAB — LIPASE, BLOOD: LIPASE: 27 U/L (ref 11–51)

## 2015-12-23 LAB — URINE MICROSCOPIC-ADD ON

## 2015-12-23 MED ORDER — MAGNESIUM CITRATE PO SOLN: 1.0000 | Freq: Once | ORAL | Status: DC

## 2015-12-23 MED ORDER — LACTULOSE 10 GM/15ML PO SOLN
30.0000 g | Freq: Once | ORAL | Status: AC
  Administered 2015-12-23: 30 g via ORAL
  Filled 2015-12-23: qty 45

## 2015-12-23 MED ORDER — PEG 3350-KCL-NA BICARB-NACL 420 G PO SOLR
4000.0000 mL | Freq: Once | ORAL | Status: DC
  Filled 2015-12-23: qty 4000

## 2015-12-23 MED ORDER — ONDANSETRON HCL 4 MG/2ML IJ SOLN
4.0000 mg | Freq: Once | INTRAMUSCULAR | Status: AC
  Administered 2015-12-23: 4 mg via INTRAVENOUS
  Filled 2015-12-23: qty 2

## 2015-12-23 MED ORDER — LACTULOSE 10 GM/15ML PO SOLN: 30.0000 g | Freq: Two times a day (BID) | ORAL | 0 refills | Status: DC

## 2015-12-23 MED ORDER — METOCLOPRAMIDE HCL 10 MG PO TABS: 10.0000 mg | ORAL_TABLET | Freq: Four times a day (QID) | ORAL | 0 refills | Status: DC

## 2015-12-23 MED ORDER — MILK AND MOLASSES ENEMA
1.0000 | Freq: Once | RECTAL | Status: AC
  Administered 2015-12-23: 250 mL via RECTAL
  Filled 2015-12-23: qty 250

## 2015-12-23 MED ORDER — SODIUM CHLORIDE 0.9 % IV BOLUS (SEPSIS)
1000.0000 mL | Freq: Once | INTRAVENOUS | Status: AC
  Administered 2015-12-23: 1000 mL via INTRAVENOUS

## 2015-12-23 NOTE — ED Notes (Signed)
Taken to Xray.

## 2015-12-23 NOTE — ED Provider Notes (Signed)
[facility] DEPT Provider Note   CSN: [id number] Arrival date & time: 12/23/15  1349     History   Chief Complaint Chief Complaint  Patient presents with  . Constipation    HPI Deborah [name] is a 50 y.o. female.  Pt reports to the ED today with constipation and abdominal pain.  The pt has not had a bowel movement in 3 weeks.  She does have a hx of slow transit colon and chronic abdominal pain.  The pt has not taken anything for her constipation this time, but she has taken miralax and dulcolax in the past w/o success.  Pt went to urgent care a week and half ago and had an xray that showed a large amount of stool.  Pt was at work today and had a bad cramp and felt like she was going to pass out.  Pt said that she's been unable to eat anything other than smoothies for the past week.      Past Medical History:  Diagnosis Date  . Chronic constipation   . Chronic pancreatitis ([place]) LOW-GRADE   FOLLOWED BY DR Sydell Axon BRODIE  . Genital herpes in women   . Hot flashes   . IBS (irritable bowel syndrome)   . Menorrhagia   . Perimenopausal   . Seasonal allergies     Patient Active Problem List   Diagnosis Date Noted  . Abdominal pain, chronic, right upper quadrant 11/12/2011  . CONSTIPATION 11/28/2009  . CHRONIC PANCREATITIS 11/28/2009  . ABNORMAL WEIGHT GAIN 11/28/2009  . NAUSEA ALONE 11/28/2009  . ABDOMINAL PAIN-EPIGASTRIC 11/28/2009  . ABDOMINAL PAIN, GENERALIZED 11/28/2009  . B12 DEFICIENCY 12/20/2008  . ABDOMINAL PAIN, LEFT UPPER QUADRANT 05/29/2008  . SCOLIOSIS 05/23/2008  . COLONIC POLYPS, HX OF 05/23/2008  . GENITAL HERPES 05/11/2007  . IRRITABLE BOWEL SYNDROME 05/11/2007  . PANCREATITIS 05/11/2007    Past Surgical History:  Procedure Laterality Date  . APPENDECTOMY  5/21/197   Incidental appy  . KNEE ARTHROSCOPY  2004   RIGHT  . 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 Authorizing Provider  Cholecalciferol (VITAMIN D3) 5000 units TABS Take 1 tablet by mouth daily.   Yes Historical Provider, MD  Docusate Calcium (STOOL SOFTENER PO) Take 1 tablet by mouth 2 (two) times daily.    Yes Historical Provider, MD  levocetirizine (XYZAL) 5 MG tablet Take 5 mg by mouth every evening.   Yes Historical Provider, MD  Melatonin 3 MG TABS Take 1 tablet by mouth at bedtime.   Yes Historical Provider, MD  metroNIDAZOLE (FLAGYL) 250 MG tablet Take 250 mg by mouth 3 (three) times daily.   Yes Historical Provider, MD  montelukast (SINGULAIR) 10 MG tablet Take 10 mg by mouth at bedtime.   Yes Historical Provider, MD  valACYclovir (VALTREX) 500 MG tablet Take 500 mg by mouth daily.   Yes Historical Provider, MD  lactulose (CHRONULAC) 10 GM/15ML solution Take 45 mLs (30 g total) by mouth 2 (two) times daily. 12/23/15   Isla Pence, MD  metoCLOPramide (REGLAN) 10 MG tablet Take 1 tablet (10 mg total) by mouth every 6 (six) hours. 12/23/15   Isla Pence, MD    Family History Family History  Problem Relation Age of Onset  . Cancer Father  prostate cancer  . Cancer Paternal Grandmother     Ovarian cancer    Social History Social History  Substance Use Topics  . Smoking status: Never Smoker  . Smokeless tobacco: Never Used  . Alcohol use No     Allergies   Ivp dye [iodinated diagnostic agents]; Ortho tri-cyclen [norgestimate-eth estradiol]; and Penicillins   Review of Systems Review of Systems  Gastrointestinal: Positive for abdominal pain, constipation and nausea.  All other systems reviewed and are negative.    Physical Exam Updated Vital Signs BP 111/70 (BP Location: Right Arm)   Pulse (!) 59   Resp 20   SpO2 100%   Physical Exam  Constitutional: She is oriented to person, place, and time. She appears well-developed and well-nourished.  HENT:   Head: Normocephalic and atraumatic.  Right Ear: External ear normal.  Left Ear: External ear normal.  Nose: Nose normal.  Mouth/Throat: Oropharynx is clear and moist.  Eyes: EOM are normal. Pupils are equal, round, and reactive to light.  Neck: Normal range of motion. Neck supple.  Cardiovascular: Normal rate, regular rhythm, normal heart sounds and intact distal pulses.   Pulmonary/Chest: Effort normal and breath sounds normal.  Abdominal: Soft. Bowel sounds are decreased. There is generalized tenderness.  Musculoskeletal: Normal range of motion.  Neurological: She is alert and oriented to person, place, and time.  Skin: Skin is warm.  Psychiatric: She has a normal mood and affect.  Nursing note and vitals reviewed.    ED Treatments / Results  Labs (all labs ordered are listed, but only abnormal results are displayed) Labs Reviewed  CBC WITH DIFFERENTIAL/PLATELET - Abnormal; Notable for the following:       Result Value   Platelets 490 (*)    All other components within normal limits  URINALYSIS, ROUTINE W REFLEX MICROSCOPIC (NOT AT [facility]) - Abnormal; Notable for the following:    APPearance CLOUDY (*)    Ketones, ur 15 (*)    Leukocytes, UA SMALL (*)    All other components within normal limits  URINE MICROSCOPIC-ADD ON - Abnormal; Notable for the following:    Squamous Epithelial / LPF 0-5 (*)    Bacteria, UA FEW (*)    All other components within normal limits  COMPREHENSIVE METABOLIC PANEL  LIPASE, BLOOD    EKG  EKG Interpretation None       Radiology Dg Abdomen Acute W/chest  Result Date: 12/23/2015 CLINICAL DATA:  Abdominal pain, nausea and constipation EXAM: DG ABDOMEN ACUTE W/ 1V CHEST COMPARISON:  None. FINDINGS: Cardiomediastinal contours are normal. No pneumothorax or sizable pleural effusion. No focal airspace consolidation or pulmonary edema. No free intraperitoneal air. On the upright radiograph, there is a mildly dilated, featureless loop of bowel  just to the left of midline in it is not seen on the supine view. This may be a dilated, gas-filled loop of small bowel versus redundant sigmoid colon. No masses or abnormal calcifications. IMPRESSION: 1. Clear lungs. 2. No free intraperitoneal air. 3. Prominent loop of gas-filled bowel just to the left of midline on the upright radiograph, possibly mildly dilated small bowel versus redundant sigmoid colon. Electronically Signed   By: Ulyses Jarred M.D.   On: 12/23/2015 16:50    Procedures Procedures (including critical care time)  Medications Ordered in ED Medications  milk and molasses enema (not administered)  polyethylene glycol-electrolytes (NuLYTELY/GoLYTELY) solution 4,000 mL (not administered)  sodium chloride 0.9 % bolus 1,000 mL (0 mLs Intravenous Stopped 12/23/15 1823)  milk and  molasses enema (250 mLs Rectal Given 12/23/15 1820)  lactulose (CHRONULAC) 10 GM/15ML solution 30 g (30 g Oral Given 12/23/15 1710)  ondansetron (ZOFRAN) injection 4 mg (4 mg Intravenous Given 12/23/15 1710)     Initial Impression / Assessment and Plan / ED Course  I have reviewed the triage vital signs and the nursing notes.  Pertinent labs & imaging results that were available during my care of the patient were reviewed by me and considered in my medical decision making (see chart for details).  Clinical Course   Pt had a large bowel movement and relief after the 2nd enema.  She refused the GoLytely, she said that made her throw up.  She did well with the lactulose, so she will be d/c'd with that and reglan.  She knows to return if worse.  Final Clinical Impressions(s) / ED Diagnoses   Final diagnoses:  Slow transit constipation    New Prescriptions New Prescriptions   LACTULOSE (CHRONULAC) 10 GM/15ML SOLUTION    Take 45 mLs (30 g total) by mouth 2 (two) times daily.   METOCLOPRAMIDE (REGLAN) 10 MG TABLET    Take 1 tablet (10 mg total) by mouth every 6 (six) hours.     Isla Pence,  MD 12/23/15 2035

## 2015-12-23 NOTE — ED Notes (Signed)
Large partly formed stool in bedopan followung the 2nd enema  She feels some better

## 2015-12-23 NOTE — ED Notes (Signed)
Placed patient back on the monitor after xray

## 2015-12-23 NOTE — ED Triage Notes (Addendum)
Pt has not had BM for 3 weeks; UC xrays full of stool; pain radiating to back. Has had colon surgeries over entire colon so possibly has adhesions. Pancreatitis, slow transient colon. Recently placed on flagyl. Hx of low WBC. 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 Has another cyst in current one. Anorexia x 1 week; mostly liquids (smoothies) due to pain. Scheduled for outpatient CT through doc at [facility]; pain was too severe. Felt like may pass out.

## 2015-12-23 NOTE — ED Notes (Signed)
2nd enema given. 

## 2015-12-23 NOTE — ED Notes (Signed)
Patient states symptoms are same as when checked in

## 2015-12-31 DIAGNOSIS — R933 Abnormal findings on diagnostic imaging of other parts of digestive tract: Secondary | ICD-10-CM

## 2015-12-31 DIAGNOSIS — R110 Nausea: Secondary | ICD-10-CM | POA: Insufficient documentation

## 2015-12-31 HISTORY — DX: Abnormal findings on diagnostic imaging of other parts of digestive tract: R93.3

## 2016-02-02 ENCOUNTER — Telehealth: Payer: Self-pay | Admitting: Pulmonary Disease

## 2016-02-02 ENCOUNTER — Encounter: Payer: Self-pay | Admitting: Pulmonary Disease

## 2016-02-02 ENCOUNTER — Ambulatory Visit (INDEPENDENT_AMBULATORY_CARE_PROVIDER_SITE_OTHER): Payer: Commercial Managed Care - HMO | Admitting: Pulmonary Disease

## 2016-02-02 VITALS — BP 92/58 | HR 66 | Ht 63.0 in | Wt 132.6 lb

## 2016-02-02 DIAGNOSIS — R911 Solitary pulmonary nodule: Secondary | ICD-10-CM | POA: Diagnosis not present

## 2016-02-02 NOTE — Progress Notes (Signed)
Past surgical history She  has a past surgical history that includes Knee arthroscopy (2004); Appendectomy (5/21/197); Splenectomy, total (08/10/1995); and Ablation (2014).  Family history Her family history includes Cancer in her father and paternal grandmother.  Social history She  reports that she has never smoked. She has never used smokeless tobacco. She reports that she does not drink alcohol or use drugs.  Allergies  Allergen Reactions  . Ivp Dye [Iodinated Diagnostic Agents] Swelling    Eyes swell  . Ortho Tri-Cyclen [Norgestimate-Eth Estradiol] Hives  . Penicillins Hives    Review of Systems  Constitutional: Negative for fever and unexpected weight change.  HENT: Negative for congestion, dental problem, ear pain, nosebleeds, postnasal drip, rhinorrhea, sinus pressure, sneezing, sore throat and trouble swallowing.   Eyes: Negative for redness and itching.  Respiratory: Negative for cough, chest tightness, shortness of breath and wheezing.   Cardiovascular: Negative for palpitations and leg swelling.  Gastrointestinal: Positive for abdominal pain. Negative for nausea and vomiting.  Genitourinary: Negative for dysuria.  Musculoskeletal: Negative for joint swelling.  Skin: Negative for rash.  Neurological: Positive for headaches.  Hematological: Does not bruise/bleed easily.  Psychiatric/Behavioral: Negative for dysphoric mood. The patient is not nervous/anxious.    Current Outpatient Prescriptions on File Prior to Visit  Medication Sig  . Docusate Calcium (STOOL SOFTENER PO) Take 1 tablet by mouth 2 (two) times daily.   Marland Kitchen levocetirizine (XYZAL) 5 MG tablet Take 5 mg by mouth every evening.  . Melatonin 3 MG TABS Take 1 tablet by mouth at bedtime.  . montelukast (SINGULAIR) 10 MG tablet Take 10 mg by mouth at bedtime.  . valACYclovir (VALTREX) 500 MG tablet Take 500 mg by mouth daily.   No current facility-administered medications on file prior to visit.     Chief  Complaint  Patient presents with  . PULMONARY CONSULT    Recent CT scan showed lung nodule. Denies any breathing issues.     Tests CT abd/pelvis 12/30/15 >> 5 mm LLL nodule  Past medical history She  has a past medical history of Chronic constipation; Chronic pancreatitis ([place]) (LOW-GRADE); Genital herpes in women; Hot flashes; IBS (irritable bowel syndrome); Menorrhagia; Perimenopausal; and Seasonal allergies.  Vital signs BP (!) 92/58 (BP Location: Left Arm, Cuff Size: Normal) Comment: pt states BP usually runs low  Pulse 66   Ht 5\' 3"  (1.6 m)   Wt 132 lb 9.6 oz (60.1 kg)   SpO2 98%   BMI 23.49 kg/m   History of present illness Deborah [name] is a 50 y.o. female with lung nodule.  She is followed by [facility] for chronic constipation and slow transit time through GI system.  She had CT abd/pelvis 12/30/15 which showed incidental finding of 5 mm lt lower lobe nodule.  This was not visualized on CT abd/pelvis from May 2012.  She never smoked cigarettes.  Her parents smoked.  She denies history of pneumonia, or exposure to tuberculosis.  She is on allergy shots.  She works in [employer].  She was never in the [employer].  She denies bird exposure.  She is from [place], and has not travelled anywhere recently.  She denies coughing, fever, weight loss, hemoptysis, gland swelling, skin rash, or leg swelling.  She has not had any trouble swallowing.  She was having chest discomfort, but this improved after her GI issues improved.  Physical exam  General - No distress ENT - No sinus tenderness, no oral exudate, no LAN, no  thyromegaly, TM clear, pupils equal/reactive, MP 4 Cardiac - s1s2 regular, no murmur, pulses symmetric Chest - No wheeze/rales/dullness, good air entry, normal respiratory excursion Back - No focal tenderness Abd - Soft, non-tender, no organomegaly, + bowel sounds Ext - No edema Neuro - Normal strength, cranial nerves intact Skin - No rashes Psych -  Normal mood, and behavior   CMP Latest Ref Rng & Units 12/23/2015 07/30/2013 11/28/2009  Glucose 65 - 99 mg/dL 92 104(H) 94  BUN 6 - 20 mg/dL 14 11 17   Creatinine 0.44 - 1.00 mg/dL 0.83 0.63 0.7  Sodium 135 - 145 mmol/L 139 138 139  Potassium 3.5 - 5.1 mmol/L 4.0 3.7 3.9  Chloride 101 - 111 mmol/L 108 100 105  CO2 22 - 32 mmol/L 23 26 28   Calcium 8.9 - 10.3 mg/dL 9.1 9.3 9.4  Total Protein 6.5 - 8.1 g/dL 6.9 8.2 7.4  Total Bilirubin 0.3 - 1.2 mg/dL 0.8 0.3 0.7  Alkaline Phos 38 - 126 U/L 67 86 61  AST 15 - 41 U/L 24 21 21   ALT 14 - 54 U/L 14 17 15      CBC Latest Ref Rng & Units 12/23/2015 07/30/2013 07/16/2011  WBC 4.0 - 10.5 K/uL 10.4 10.5 12.6(H)  Hemoglobin 12.0 - 15.0 g/dL 14.0 14.8 13.0  Hematocrit 36.0 - 46.0 % 42.2 42.1 37.4  Platelets 150 - 400 K/uL 490(H) 571(H) 538(H)    Discussion She has incidental finding of 5 mm LLL nodule on CT abd/pelvis.  There is no history of smoking.  This is likely a benign lesion.  Having said this, the imaging studies she had did not include all of her lungs, and she might have additional findings in her upper lungs.  Assessment/plan  5 mm Lt lower lung nodule. - will arrange for  CT chest w/o contrast (she has contrast allergy) to further assess whether she has other findings in her lungs not visualized on recent CT abd/pelvis   Patient Instructions  Will schedule CT chest without contrast and call with results  Will call to schedule follow up based on CT chest results    Chesley Mires, MD [facility] Pager:  [phone] 02/02/2016, 3:56 PM

## 2016-02-02 NOTE — Telephone Encounter (Signed)
Will route message to Ashtyn to make her aware of this information.

## 2016-02-02 NOTE — Progress Notes (Signed)
   Subjective:    Patient ID: Deborah [name], female    DOB: 19-Nov-1965, 50 y.o.   MRN: [id number]  HPI    Review of Systems  Constitutional: Negative for fever and unexpected weight change.  HENT: Negative for congestion, dental problem, ear pain, nosebleeds, postnasal drip, rhinorrhea, sinus pressure, sneezing, sore throat and trouble swallowing.   Eyes: Negative for redness and itching.  Respiratory: Negative for cough, chest tightness, shortness of breath and wheezing.   Cardiovascular: Negative for palpitations and leg swelling.  Gastrointestinal: Positive for abdominal pain. Negative for nausea and vomiting.  Genitourinary: Negative for dysuria.  Musculoskeletal: Negative for joint swelling.  Skin: Negative for rash.  Neurological: Positive for headaches.  Hematological: Does not bruise/bleed easily.  Psychiatric/Behavioral: Negative for dysphoric mood. The patient is not nervous/anxious.        Objective:   Physical Exam        Assessment & Plan:

## 2016-02-02 NOTE — Telephone Encounter (Signed)
Noted.  Patient is here now and is being seen - CT scans are being reviewed with patient.  Nothing further needed.

## 2016-02-02 NOTE — Patient Instructions (Addendum)
Will schedule CT chest without contrast and call with results  Will call to schedule follow up based on CT chest results

## 2016-02-09 ENCOUNTER — Ambulatory Visit (INDEPENDENT_AMBULATORY_CARE_PROVIDER_SITE_OTHER)
Admission: RE | Admit: 2016-02-09 | Discharge: 2016-02-09 | Disposition: A | Discharge status: Home / Self Care | Payer: Commercial Managed Care - HMO | Source: Ambulatory Visit | Attending: Pulmonary Disease | Admitting: Pulmonary Disease

## 2016-02-09 DIAGNOSIS — R911 Solitary pulmonary nodule: Secondary | ICD-10-CM | POA: Diagnosis not present

## 2016-02-10 ENCOUNTER — Telehealth: Payer: Self-pay | Admitting: Pulmonary Disease

## 2016-02-10 NOTE — Telephone Encounter (Signed)
CT chest 02/09/16 >> 4 mm nodule LLL   Left message.  Explained that she is low risk since she never smoked, and likely a benign lesion.  Advised that clinical observation versus repeat CT chest w/o contrast in 1 year are options to consider.  Advised her to call back to discuss in more detail.  Will route to my nurse to ensure patient received message.

## 2016-02-13 NOTE — Telephone Encounter (Signed)
LM x 1 

## 2016-02-26 NOTE — Telephone Encounter (Signed)
LM x 2

## 2016-03-02 NOTE — Telephone Encounter (Signed)
Patient states she did get the message with results and may call her if needed but she isn't asking for a call back.

## 2016-03-02 NOTE — Telephone Encounter (Signed)
Noted.  Will route to Dr Halford Chessman to advise if there was something additional that needed to be discusses before closing the message.

## 2016-03-03 NOTE — Telephone Encounter (Signed)
Patient returned phone call...contact # [phone].Deborah [name]

## 2016-03-03 NOTE — Telephone Encounter (Signed)
Nothing additional needed.

## 2016-03-03 NOTE — Telephone Encounter (Signed)
Lmtcb. Will await call back 

## 2016-03-03 NOTE — Telephone Encounter (Signed)
Pt aware. Nothing further needed 

## 2016-03-24 DIAGNOSIS — J3081 Allergic rhinitis due to animal (cat) (dog) hair and dander: Secondary | ICD-10-CM | POA: Diagnosis not present

## 2016-03-24 DIAGNOSIS — R198 Other specified symptoms and signs involving the digestive system and abdomen: Secondary | ICD-10-CM | POA: Diagnosis not present

## 2016-03-24 DIAGNOSIS — K5902 Outlet dysfunction constipation: Secondary | ICD-10-CM | POA: Diagnosis not present

## 2016-03-24 DIAGNOSIS — R279 Unspecified lack of coordination: Secondary | ICD-10-CM | POA: Diagnosis not present

## 2016-03-24 DIAGNOSIS — J301 Allergic rhinitis due to pollen: Secondary | ICD-10-CM | POA: Diagnosis not present

## 2016-03-24 DIAGNOSIS — J3089 Other allergic rhinitis: Secondary | ICD-10-CM | POA: Diagnosis not present

## 2016-04-05 DIAGNOSIS — J3081 Allergic rhinitis due to animal (cat) (dog) hair and dander: Secondary | ICD-10-CM | POA: Diagnosis not present

## 2016-04-05 DIAGNOSIS — J301 Allergic rhinitis due to pollen: Secondary | ICD-10-CM | POA: Diagnosis not present

## 2016-04-05 DIAGNOSIS — J3089 Other allergic rhinitis: Secondary | ICD-10-CM | POA: Diagnosis not present

## 2016-04-28 DIAGNOSIS — R198 Other specified symptoms and signs involving the digestive system and abdomen: Secondary | ICD-10-CM | POA: Diagnosis not present

## 2016-04-28 DIAGNOSIS — R279 Unspecified lack of coordination: Secondary | ICD-10-CM | POA: Diagnosis not present

## 2016-04-28 DIAGNOSIS — K5902 Outlet dysfunction constipation: Secondary | ICD-10-CM | POA: Diagnosis not present

## 2016-05-03 DIAGNOSIS — J3081 Allergic rhinitis due to animal (cat) (dog) hair and dander: Secondary | ICD-10-CM | POA: Diagnosis not present

## 2016-05-03 DIAGNOSIS — J3089 Other allergic rhinitis: Secondary | ICD-10-CM | POA: Diagnosis not present

## 2016-05-03 DIAGNOSIS — J301 Allergic rhinitis due to pollen: Secondary | ICD-10-CM | POA: Diagnosis not present

## 2016-05-06 DIAGNOSIS — J3089 Other allergic rhinitis: Secondary | ICD-10-CM | POA: Diagnosis not present

## 2016-05-06 DIAGNOSIS — J301 Allergic rhinitis due to pollen: Secondary | ICD-10-CM | POA: Diagnosis not present

## 2016-05-06 DIAGNOSIS — J3081 Allergic rhinitis due to animal (cat) (dog) hair and dander: Secondary | ICD-10-CM | POA: Diagnosis not present

## 2016-05-17 DIAGNOSIS — J3081 Allergic rhinitis due to animal (cat) (dog) hair and dander: Secondary | ICD-10-CM | POA: Diagnosis not present

## 2016-05-17 DIAGNOSIS — J301 Allergic rhinitis due to pollen: Secondary | ICD-10-CM | POA: Diagnosis not present

## 2016-05-17 DIAGNOSIS — J3089 Other allergic rhinitis: Secondary | ICD-10-CM | POA: Diagnosis not present

## 2016-05-21 DIAGNOSIS — J3081 Allergic rhinitis due to animal (cat) (dog) hair and dander: Secondary | ICD-10-CM | POA: Diagnosis not present

## 2016-05-21 DIAGNOSIS — J301 Allergic rhinitis due to pollen: Secondary | ICD-10-CM | POA: Diagnosis not present

## 2016-05-21 DIAGNOSIS — J3089 Other allergic rhinitis: Secondary | ICD-10-CM | POA: Diagnosis not present

## 2016-05-25 DIAGNOSIS — J3081 Allergic rhinitis due to animal (cat) (dog) hair and dander: Secondary | ICD-10-CM | POA: Diagnosis not present

## 2016-05-25 DIAGNOSIS — J3089 Other allergic rhinitis: Secondary | ICD-10-CM | POA: Diagnosis not present

## 2016-05-25 DIAGNOSIS — J301 Allergic rhinitis due to pollen: Secondary | ICD-10-CM | POA: Diagnosis not present

## 2016-05-26 DIAGNOSIS — E538 Deficiency of other specified B group vitamins: Secondary | ICD-10-CM | POA: Diagnosis not present

## 2016-05-27 DIAGNOSIS — J3089 Other allergic rhinitis: Secondary | ICD-10-CM | POA: Diagnosis not present

## 2016-05-27 DIAGNOSIS — J3081 Allergic rhinitis due to animal (cat) (dog) hair and dander: Secondary | ICD-10-CM | POA: Diagnosis not present

## 2016-05-27 DIAGNOSIS — J301 Allergic rhinitis due to pollen: Secondary | ICD-10-CM | POA: Diagnosis not present

## 2016-06-02 DIAGNOSIS — R279 Unspecified lack of coordination: Secondary | ICD-10-CM | POA: Diagnosis not present

## 2016-06-02 DIAGNOSIS — R198 Other specified symptoms and signs involving the digestive system and abdomen: Secondary | ICD-10-CM | POA: Diagnosis not present

## 2016-06-02 DIAGNOSIS — K5902 Outlet dysfunction constipation: Secondary | ICD-10-CM | POA: Diagnosis not present

## 2016-06-03 DIAGNOSIS — J301 Allergic rhinitis due to pollen: Secondary | ICD-10-CM | POA: Diagnosis not present

## 2016-06-03 DIAGNOSIS — J3081 Allergic rhinitis due to animal (cat) (dog) hair and dander: Secondary | ICD-10-CM | POA: Diagnosis not present

## 2016-06-03 DIAGNOSIS — J3089 Other allergic rhinitis: Secondary | ICD-10-CM | POA: Diagnosis not present

## 2016-06-07 DIAGNOSIS — Z6823 Body mass index (BMI) 23.0-23.9, adult: Secondary | ICD-10-CM | POA: Diagnosis not present

## 2016-06-07 DIAGNOSIS — Z01419 Encounter for gynecological examination (general) (routine) without abnormal findings: Secondary | ICD-10-CM | POA: Diagnosis not present

## 2016-06-17 DIAGNOSIS — J3081 Allergic rhinitis due to animal (cat) (dog) hair and dander: Secondary | ICD-10-CM | POA: Diagnosis not present

## 2016-06-17 DIAGNOSIS — J3089 Other allergic rhinitis: Secondary | ICD-10-CM | POA: Diagnosis not present

## 2016-06-17 DIAGNOSIS — J301 Allergic rhinitis due to pollen: Secondary | ICD-10-CM | POA: Diagnosis not present

## 2016-06-28 DIAGNOSIS — J3089 Other allergic rhinitis: Secondary | ICD-10-CM | POA: Diagnosis not present

## 2016-06-28 DIAGNOSIS — J301 Allergic rhinitis due to pollen: Secondary | ICD-10-CM | POA: Diagnosis not present

## 2016-06-28 DIAGNOSIS — J3081 Allergic rhinitis due to animal (cat) (dog) hair and dander: Secondary | ICD-10-CM | POA: Diagnosis not present

## 2016-07-01 ENCOUNTER — Encounter: Payer: Self-pay | Admitting: Hematology and Oncology

## 2016-07-01 ENCOUNTER — Telehealth: Payer: Self-pay | Admitting: Hematology and Oncology

## 2016-07-01 NOTE — Telephone Encounter (Signed)
Appt has been scheduled for the pt to see Dr. Lindi Adie on 4/24 at 1pm. Pt demographics verified. Pt aware to arrive 30 minutes early. Letter mailed.

## 2016-07-08 DIAGNOSIS — J3089 Other allergic rhinitis: Secondary | ICD-10-CM | POA: Diagnosis not present

## 2016-07-08 DIAGNOSIS — J3081 Allergic rhinitis due to animal (cat) (dog) hair and dander: Secondary | ICD-10-CM | POA: Diagnosis not present

## 2016-07-08 DIAGNOSIS — J301 Allergic rhinitis due to pollen: Secondary | ICD-10-CM | POA: Diagnosis not present

## 2016-07-13 ENCOUNTER — Ambulatory Visit (HOSPITAL_BASED_OUTPATIENT_CLINIC_OR_DEPARTMENT_OTHER): Payer: 59 | Admitting: Hematology and Oncology

## 2016-07-13 ENCOUNTER — Encounter: Payer: Self-pay | Admitting: Hematology and Oncology

## 2016-07-13 DIAGNOSIS — J3081 Allergic rhinitis due to animal (cat) (dog) hair and dander: Secondary | ICD-10-CM | POA: Diagnosis not present

## 2016-07-13 DIAGNOSIS — D75839 Thrombocytosis, unspecified: Secondary | ICD-10-CM

## 2016-07-13 DIAGNOSIS — J301 Allergic rhinitis due to pollen: Secondary | ICD-10-CM | POA: Diagnosis not present

## 2016-07-13 DIAGNOSIS — D473 Essential (hemorrhagic) thrombocythemia: Secondary | ICD-10-CM

## 2016-07-13 DIAGNOSIS — J3089 Other allergic rhinitis: Secondary | ICD-10-CM | POA: Diagnosis not present

## 2016-07-13 HISTORY — DX: Thrombocytosis, unspecified: D75.839

## 2016-07-13 NOTE — Progress Notes (Signed)
[place] NOTE  Patient Care Team: Aretta Nip, MD as PCP - General (Family Medicine)  CHIEF COMPLAINTS/PURPOSE OF CONSULTATION:  Elevated platelet count  HISTORY OF PRESENTING ILLNESS:  Deborah [name] 51 y.o. female is here because of elevated platelet counts. Patient has been complaining of fatigue and headaches and low blood pressure for several months. She is also being dizzy and feeling like she may pass out. She had extensive workup including thyroid function tests which were normal. On the CBC there was elevation of platelet count of 469. Because of her multitude of symptoms, she was referred to [place] for discussion and management of elevated platelet count. On her past history is interesting to note that she has had a splenectomy almost 20 years ago. She went had her accessory spleen taken out at that time apparently for a cyst inside of the spleen. 4 years ago it appears that she had elevation of platelet count in the 500s and it had been relatively steady over the past couple of years. She complains of fatigue and lightheadedness and headaches. I reviewed her records extensively and collaborated the history with the patient.  MEDICAL HISTORY:  Past Medical History:  Diagnosis Date  . Chronic constipation   . Chronic pancreatitis ([place]) LOW-GRADE   FOLLOWED BY DR Sydell Axon BRODIE  . Genital herpes in women   . Hot flashes   . IBS (irritable bowel syndrome)   . Menorrhagia   . Perimenopausal   . Seasonal allergies     SURGICAL HISTORY: Past Surgical History:  Procedure Laterality Date  . ABLATION  2014  . APPENDECTOMY  5/21/197   Incidental appy  . KNEE ARTHROSCOPY  2004   RIGHT  . 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 Cancer Father     prostate cancer  .  Cancer Paternal Grandmother     Ovarian cancer    ALLERGIES:  is allergic to ivp dye [iodinated diagnostic agents]; ortho tri-cyclen [norgestimate-eth estradiol]; and penicillins.  MEDICATIONS:  Current Outpatient Prescriptions  Medication Sig Dispense Refill  . Cholecalciferol (VITAMIN D3) 1000 units CAPS Take 1,000 tablets by mouth daily.     . Cyanocobalamin (B-12 COMPLIANCE INJECTION IJ) Inject as directed.    Mariane Baumgarten Calcium (STOOL SOFTENER PO) Take 1 tablet by mouth 2 (two) times daily.     . fluticasone (FLONASE) 50 MCG/ACT nasal spray Place 1 spray into both nostrils daily.    . Lactobacillus (PROBIOTIC ACIDOPHILUS PO) Take by mouth.    . levocetirizine (XYZAL) 5 MG tablet Take 5 mg by mouth every evening.    . Lubiprostone (AMITIZA PO) Take 24 mcg by mouth 2 (two) times daily.     . Melatonin 5 MG TABS Take 1 tablet by mouth at bedtime.    . montelukast (SINGULAIR) 10 MG tablet Take 10 mg by mouth at bedtime.    . valACYclovir (VALTREX) 500 MG tablet Take 500 mg by mouth daily.    Marland Kitchen saccharomyces boulardii (FLORASTOR) 250 MG capsule Take 250 mg by mouth 2 (two) times daily.     No current facility-administered medications for this visit.     REVIEW OF SYSTEMS:   Constitutional: Denies fevers, chills or abnormal night sweats Eyes: Denies blurriness of vision, double vision or watery eyes Ears, nose, mouth, throat,  and face: Denies mucositis or sore throat Respiratory: Denies cough, dyspnea or wheezes Cardiovascular: Denies palpitation, chest discomfort or lower extremity swelling Gastrointestinal:  Denies nausea, heartburn or change in bowel habits Skin: Denies abnormal skin rashes Lymphatics: Denies new lymphadenopathy or easy bruising Neurological:Denies numbness, tingling or new weaknesses Behavioral/Psych: Mood is stable, no new changes  All other systems were reviewed with the patient and are negative.  PHYSICAL EXAMINATION: ECOG PERFORMANCE STATUS: 1 -  Symptomatic but completely ambulatory  Vitals:   07/13/16 1250  BP: (!) 93/53  Pulse: (!) 59  Resp: 18  Temp: 98.1 F (36.7 C)   Filed Weights   07/13/16 1250  Weight: 134 lb 3.2 oz (60.9 kg)    GENERAL:alert, no distress and comfortable SKIN: skin color, texture, turgor are normal, no rashes or significant lesions EYES: normal, conjunctiva are pink and non-injected, sclera clear OROPHARYNX:no exudate, no erythema and lips, buccal mucosa, and tongue normal  NECK: supple, thyroid normal size, non-tender, without nodularity LYMPH:  no palpable lymphadenopathy in the cervical, axillary or inguinal LUNGS: clear to auscultation and percussion with normal breathing effort HEART: regular rate & rhythm and no murmurs and no lower extremity edema ABDOMEN:abdomen soft, non-tender and normal bowel sounds Musculoskeletal:no cyanosis of digits and no clubbing  PSYCH: alert & oriented x 3 with fluent speech NEURO: no focal motor/sensory deficits   LABORATORY DATA:  I have reviewed the data as listed Lab Results  Component Value Date   WBC 10.4 12/23/2015   HGB 14.0 12/23/2015   HCT 42.2 12/23/2015   MCV 98.8 12/23/2015   PLT 490 (H) 12/23/2015   Lab Results  Component Value Date   NA 139 12/23/2015   K 4.0 12/23/2015   CL 108 12/23/2015   CO2 23 12/23/2015    RADIOGRAPHIC STUDIES: I have personally reviewed the radiological reports and agreed with the findings in the report.  ASSESSMENT AND PLAN:  Thrombocytosis ([place]) Thrombocytosis: Review of the labs revealed platelet counts 469 on 06/07/2016. Patient has had mild elevation of platelets atleast for the last 4 years (538 in 2014; 571 in 2016 and 490 six months ago)  Differential diagnosis Patient had splenectomy 20 years ago. This is most likely the cause of elevated platelet count. I'm suspecting that she may have had markedly increased platelet count soon after the splenectomy but over time it might have improved and the  platelet numbers have come back to close to normal.  I discussed with her the differential diagnosis of thrombocytosis which includes inflammation, iron deficiency anemia, myeloproliferative disorders like essential thrombocytosis. I do not suspect any of these disorders. Because she has a clear-cut etiology that can explain her elevation of platelet count, I do not recommend any other blood tests are procedure site bone marrow biopsies at this time. If however her blood counts markedly get worse then we can see her back to do those tests.  Severe fatigue and hypotension and headaches: Most likely they're all related to the low blood pressure. I discussed with her that endocrine causes need to be investigated thoroughly including adrenal insufficiency. Ultimately some of the symptoms could be menopausal related. I reviewed her electrolyte panel and it is normal. There is no evidence of hyponatremia before it makes it less likely to be intrarenal related. I discussed with her about talking to her primary care physician about different options including seeing a cardiologist.  All questions were answered. The patient knows to call the clinic with any problems, questions or concerns.  Rulon Eisenmenger, MD 07/13/16

## 2016-07-13 NOTE — Assessment & Plan Note (Signed)
Thrombocytosis: Review of the labs revealed platelet counts 469 on 06/07/2016. Patient has had mild elevation of platelets atleast for the last 4 years (538 in 2014; 571 in 2016 and 490 six months ago)  Differential diagnosis 1. Primary thrombocytosis: Related to myeloproliferative disorders of the bone marrow especially essential thrombocytosis and CML. I would like to send for BCR-ABL as well as JAK-2 dictation testings. Patient understands that JAK2 mutation is only present in 50% of essential thrombocytosis so the test is advantageous only if it is positive. If it is negative, it does not rule out. 2. Secondary/reactive thrombocytosis Different causes including infections, inflammation, iron deficiency.  I would like to send out for C-reactive protein, iron studies with ferritin to complete the workup.  Treatment options: 1. If it is primary essential thrombocytosis, treatment would depend on platelet count level as well as history of thrombosis. For low risk patients such as her, (platelet counts less than 1000 and no history of blood clots) the treatment would be with aspirin therapy  2. Treatment of secondary thrombocytosis would be to treat underlying cause. There would not be any risk of thrombosis with secondary thrombocytosis.  Return to clinic in 3 weeks to discuss the results of these tests.

## 2016-07-21 DIAGNOSIS — J3089 Other allergic rhinitis: Secondary | ICD-10-CM | POA: Diagnosis not present

## 2016-07-21 DIAGNOSIS — J3081 Allergic rhinitis due to animal (cat) (dog) hair and dander: Secondary | ICD-10-CM | POA: Diagnosis not present

## 2016-07-21 DIAGNOSIS — J301 Allergic rhinitis due to pollen: Secondary | ICD-10-CM | POA: Diagnosis not present

## 2016-07-26 DIAGNOSIS — E538 Deficiency of other specified B group vitamins: Secondary | ICD-10-CM | POA: Diagnosis not present

## 2016-07-28 DIAGNOSIS — J3089 Other allergic rhinitis: Secondary | ICD-10-CM | POA: Diagnosis not present

## 2016-07-28 DIAGNOSIS — J301 Allergic rhinitis due to pollen: Secondary | ICD-10-CM | POA: Diagnosis not present

## 2016-07-28 DIAGNOSIS — J3081 Allergic rhinitis due to animal (cat) (dog) hair and dander: Secondary | ICD-10-CM | POA: Diagnosis not present

## 2016-08-09 DIAGNOSIS — J3089 Other allergic rhinitis: Secondary | ICD-10-CM | POA: Diagnosis not present

## 2016-08-09 DIAGNOSIS — J3081 Allergic rhinitis due to animal (cat) (dog) hair and dander: Secondary | ICD-10-CM | POA: Diagnosis not present

## 2016-08-09 DIAGNOSIS — K5901 Slow transit constipation: Secondary | ICD-10-CM | POA: Diagnosis not present

## 2016-08-09 DIAGNOSIS — J301 Allergic rhinitis due to pollen: Secondary | ICD-10-CM | POA: Diagnosis not present

## 2016-08-09 DIAGNOSIS — K5902 Outlet dysfunction constipation: Secondary | ICD-10-CM | POA: Diagnosis not present

## 2016-08-09 DIAGNOSIS — K581 Irritable bowel syndrome with constipation: Secondary | ICD-10-CM | POA: Diagnosis not present

## 2016-08-20 DIAGNOSIS — J3089 Other allergic rhinitis: Secondary | ICD-10-CM | POA: Diagnosis not present

## 2016-08-20 DIAGNOSIS — J301 Allergic rhinitis due to pollen: Secondary | ICD-10-CM | POA: Diagnosis not present

## 2016-08-20 DIAGNOSIS — J3081 Allergic rhinitis due to animal (cat) (dog) hair and dander: Secondary | ICD-10-CM | POA: Diagnosis not present

## 2016-08-26 DIAGNOSIS — J301 Allergic rhinitis due to pollen: Secondary | ICD-10-CM | POA: Diagnosis not present

## 2016-08-26 DIAGNOSIS — J3089 Other allergic rhinitis: Secondary | ICD-10-CM | POA: Diagnosis not present

## 2016-08-26 DIAGNOSIS — J3081 Allergic rhinitis due to animal (cat) (dog) hair and dander: Secondary | ICD-10-CM | POA: Diagnosis not present

## 2016-08-30 DIAGNOSIS — E538 Deficiency of other specified B group vitamins: Secondary | ICD-10-CM | POA: Diagnosis not present

## 2016-09-01 DIAGNOSIS — N951 Menopausal and female climacteric states: Secondary | ICD-10-CM | POA: Diagnosis not present

## 2016-09-09 DIAGNOSIS — M67432 Ganglion, left wrist: Secondary | ICD-10-CM | POA: Diagnosis not present

## 2016-09-09 DIAGNOSIS — J301 Allergic rhinitis due to pollen: Secondary | ICD-10-CM | POA: Diagnosis not present

## 2016-09-09 DIAGNOSIS — J3089 Other allergic rhinitis: Secondary | ICD-10-CM | POA: Diagnosis not present

## 2016-09-09 DIAGNOSIS — J3081 Allergic rhinitis due to animal (cat) (dog) hair and dander: Secondary | ICD-10-CM | POA: Diagnosis not present

## 2016-09-09 DIAGNOSIS — M542 Cervicalgia: Secondary | ICD-10-CM | POA: Diagnosis not present

## 2016-09-16 DIAGNOSIS — J301 Allergic rhinitis due to pollen: Secondary | ICD-10-CM | POA: Diagnosis not present

## 2016-09-16 DIAGNOSIS — J3089 Other allergic rhinitis: Secondary | ICD-10-CM | POA: Diagnosis not present

## 2016-09-16 DIAGNOSIS — J3081 Allergic rhinitis due to animal (cat) (dog) hair and dander: Secondary | ICD-10-CM | POA: Diagnosis not present

## 2016-09-28 DIAGNOSIS — J3089 Other allergic rhinitis: Secondary | ICD-10-CM | POA: Diagnosis not present

## 2016-09-28 DIAGNOSIS — J301 Allergic rhinitis due to pollen: Secondary | ICD-10-CM | POA: Diagnosis not present

## 2016-09-28 DIAGNOSIS — J3081 Allergic rhinitis due to animal (cat) (dog) hair and dander: Secondary | ICD-10-CM | POA: Diagnosis not present

## 2016-10-07 DIAGNOSIS — J3089 Other allergic rhinitis: Secondary | ICD-10-CM | POA: Diagnosis not present

## 2016-10-07 DIAGNOSIS — J301 Allergic rhinitis due to pollen: Secondary | ICD-10-CM | POA: Diagnosis not present

## 2016-10-07 DIAGNOSIS — J3081 Allergic rhinitis due to animal (cat) (dog) hair and dander: Secondary | ICD-10-CM | POA: Diagnosis not present

## 2016-10-13 DIAGNOSIS — Z719 Counseling, unspecified: Secondary | ICD-10-CM | POA: Diagnosis not present

## 2016-10-21 DIAGNOSIS — J3089 Other allergic rhinitis: Secondary | ICD-10-CM | POA: Diagnosis not present

## 2016-10-21 DIAGNOSIS — J3081 Allergic rhinitis due to animal (cat) (dog) hair and dander: Secondary | ICD-10-CM | POA: Diagnosis not present

## 2016-10-21 DIAGNOSIS — J301 Allergic rhinitis due to pollen: Secondary | ICD-10-CM | POA: Diagnosis not present

## 2016-10-22 DIAGNOSIS — E538 Deficiency of other specified B group vitamins: Secondary | ICD-10-CM | POA: Diagnosis not present

## 2016-10-25 DIAGNOSIS — J301 Allergic rhinitis due to pollen: Secondary | ICD-10-CM | POA: Diagnosis not present

## 2016-10-26 DIAGNOSIS — J3081 Allergic rhinitis due to animal (cat) (dog) hair and dander: Secondary | ICD-10-CM | POA: Diagnosis not present

## 2016-10-26 DIAGNOSIS — Z719 Counseling, unspecified: Secondary | ICD-10-CM | POA: Diagnosis not present

## 2016-10-26 DIAGNOSIS — J3089 Other allergic rhinitis: Secondary | ICD-10-CM | POA: Diagnosis not present

## 2016-10-28 DIAGNOSIS — J301 Allergic rhinitis due to pollen: Secondary | ICD-10-CM | POA: Diagnosis not present

## 2016-10-28 DIAGNOSIS — J3081 Allergic rhinitis due to animal (cat) (dog) hair and dander: Secondary | ICD-10-CM | POA: Diagnosis not present

## 2016-10-28 DIAGNOSIS — J3089 Other allergic rhinitis: Secondary | ICD-10-CM | POA: Diagnosis not present

## 2016-11-02 DIAGNOSIS — Z719 Counseling, unspecified: Secondary | ICD-10-CM | POA: Diagnosis not present

## 2016-11-04 DIAGNOSIS — J3081 Allergic rhinitis due to animal (cat) (dog) hair and dander: Secondary | ICD-10-CM | POA: Diagnosis not present

## 2016-11-04 DIAGNOSIS — J3089 Other allergic rhinitis: Secondary | ICD-10-CM | POA: Diagnosis not present

## 2016-11-04 DIAGNOSIS — J301 Allergic rhinitis due to pollen: Secondary | ICD-10-CM | POA: Diagnosis not present

## 2016-11-12 DIAGNOSIS — J301 Allergic rhinitis due to pollen: Secondary | ICD-10-CM | POA: Diagnosis not present

## 2016-11-12 DIAGNOSIS — J3089 Other allergic rhinitis: Secondary | ICD-10-CM | POA: Diagnosis not present

## 2016-11-12 DIAGNOSIS — J3081 Allergic rhinitis due to animal (cat) (dog) hair and dander: Secondary | ICD-10-CM | POA: Diagnosis not present

## 2016-11-16 DIAGNOSIS — Z719 Counseling, unspecified: Secondary | ICD-10-CM | POA: Diagnosis not present

## 2016-11-18 DIAGNOSIS — J3089 Other allergic rhinitis: Secondary | ICD-10-CM | POA: Diagnosis not present

## 2016-11-18 DIAGNOSIS — J301 Allergic rhinitis due to pollen: Secondary | ICD-10-CM | POA: Diagnosis not present

## 2016-11-18 DIAGNOSIS — J3081 Allergic rhinitis due to animal (cat) (dog) hair and dander: Secondary | ICD-10-CM | POA: Diagnosis not present

## 2016-12-01 DIAGNOSIS — J301 Allergic rhinitis due to pollen: Secondary | ICD-10-CM | POA: Diagnosis not present

## 2016-12-01 DIAGNOSIS — J3081 Allergic rhinitis due to animal (cat) (dog) hair and dander: Secondary | ICD-10-CM | POA: Diagnosis not present

## 2016-12-01 DIAGNOSIS — H1045 Other chronic allergic conjunctivitis: Secondary | ICD-10-CM | POA: Diagnosis not present

## 2016-12-01 DIAGNOSIS — J3089 Other allergic rhinitis: Secondary | ICD-10-CM | POA: Diagnosis not present

## 2016-12-01 DIAGNOSIS — R05 Cough: Secondary | ICD-10-CM | POA: Diagnosis not present

## 2016-12-13 DIAGNOSIS — E538 Deficiency of other specified B group vitamins: Secondary | ICD-10-CM | POA: Diagnosis not present

## 2016-12-13 DIAGNOSIS — E559 Vitamin D deficiency, unspecified: Secondary | ICD-10-CM | POA: Diagnosis not present

## 2016-12-13 DIAGNOSIS — K5902 Outlet dysfunction constipation: Secondary | ICD-10-CM | POA: Diagnosis not present

## 2016-12-16 DIAGNOSIS — J3081 Allergic rhinitis due to animal (cat) (dog) hair and dander: Secondary | ICD-10-CM | POA: Diagnosis not present

## 2016-12-16 DIAGNOSIS — J3089 Other allergic rhinitis: Secondary | ICD-10-CM | POA: Diagnosis not present

## 2016-12-16 DIAGNOSIS — J301 Allergic rhinitis due to pollen: Secondary | ICD-10-CM | POA: Diagnosis not present

## 2016-12-23 DIAGNOSIS — J301 Allergic rhinitis due to pollen: Secondary | ICD-10-CM | POA: Diagnosis not present

## 2016-12-23 DIAGNOSIS — J3081 Allergic rhinitis due to animal (cat) (dog) hair and dander: Secondary | ICD-10-CM | POA: Diagnosis not present

## 2016-12-23 DIAGNOSIS — J3089 Other allergic rhinitis: Secondary | ICD-10-CM | POA: Diagnosis not present

## 2017-01-03 DIAGNOSIS — J3081 Allergic rhinitis due to animal (cat) (dog) hair and dander: Secondary | ICD-10-CM | POA: Diagnosis not present

## 2017-01-03 DIAGNOSIS — J3089 Other allergic rhinitis: Secondary | ICD-10-CM | POA: Diagnosis not present

## 2017-01-03 DIAGNOSIS — J301 Allergic rhinitis due to pollen: Secondary | ICD-10-CM | POA: Diagnosis not present

## 2017-01-12 DIAGNOSIS — Z23 Encounter for immunization: Secondary | ICD-10-CM | POA: Diagnosis not present

## 2017-01-13 DIAGNOSIS — J301 Allergic rhinitis due to pollen: Secondary | ICD-10-CM | POA: Diagnosis not present

## 2017-01-13 DIAGNOSIS — J3089 Other allergic rhinitis: Secondary | ICD-10-CM | POA: Diagnosis not present

## 2017-01-13 DIAGNOSIS — J3081 Allergic rhinitis due to animal (cat) (dog) hair and dander: Secondary | ICD-10-CM | POA: Diagnosis not present

## 2017-01-19 DIAGNOSIS — J3089 Other allergic rhinitis: Secondary | ICD-10-CM | POA: Diagnosis not present

## 2017-01-19 DIAGNOSIS — J301 Allergic rhinitis due to pollen: Secondary | ICD-10-CM | POA: Diagnosis not present

## 2017-01-19 DIAGNOSIS — J3081 Allergic rhinitis due to animal (cat) (dog) hair and dander: Secondary | ICD-10-CM | POA: Diagnosis not present

## 2017-01-28 DIAGNOSIS — J301 Allergic rhinitis due to pollen: Secondary | ICD-10-CM | POA: Diagnosis not present

## 2017-01-28 DIAGNOSIS — J3081 Allergic rhinitis due to animal (cat) (dog) hair and dander: Secondary | ICD-10-CM | POA: Diagnosis not present

## 2017-01-28 DIAGNOSIS — J3089 Other allergic rhinitis: Secondary | ICD-10-CM | POA: Diagnosis not present

## 2017-02-02 DIAGNOSIS — E538 Deficiency of other specified B group vitamins: Secondary | ICD-10-CM | POA: Diagnosis not present

## 2017-02-17 DIAGNOSIS — J3081 Allergic rhinitis due to animal (cat) (dog) hair and dander: Secondary | ICD-10-CM | POA: Diagnosis not present

## 2017-02-17 DIAGNOSIS — J3089 Other allergic rhinitis: Secondary | ICD-10-CM | POA: Diagnosis not present

## 2017-02-17 DIAGNOSIS — J301 Allergic rhinitis due to pollen: Secondary | ICD-10-CM | POA: Diagnosis not present

## 2017-03-02 DIAGNOSIS — J301 Allergic rhinitis due to pollen: Secondary | ICD-10-CM | POA: Diagnosis not present

## 2017-03-02 DIAGNOSIS — J3089 Other allergic rhinitis: Secondary | ICD-10-CM | POA: Diagnosis not present

## 2017-03-02 DIAGNOSIS — J3081 Allergic rhinitis due to animal (cat) (dog) hair and dander: Secondary | ICD-10-CM | POA: Diagnosis not present

## 2017-03-08 DIAGNOSIS — J3089 Other allergic rhinitis: Secondary | ICD-10-CM | POA: Diagnosis not present

## 2017-03-08 DIAGNOSIS — J3081 Allergic rhinitis due to animal (cat) (dog) hair and dander: Secondary | ICD-10-CM | POA: Diagnosis not present

## 2017-03-08 DIAGNOSIS — J301 Allergic rhinitis due to pollen: Secondary | ICD-10-CM | POA: Diagnosis not present

## 2017-03-31 DIAGNOSIS — J301 Allergic rhinitis due to pollen: Secondary | ICD-10-CM | POA: Diagnosis not present

## 2017-03-31 DIAGNOSIS — J3089 Other allergic rhinitis: Secondary | ICD-10-CM | POA: Diagnosis not present

## 2017-03-31 DIAGNOSIS — J3081 Allergic rhinitis due to animal (cat) (dog) hair and dander: Secondary | ICD-10-CM | POA: Diagnosis not present

## 2017-04-16 DIAGNOSIS — J019 Acute sinusitis, unspecified: Secondary | ICD-10-CM | POA: Diagnosis not present

## 2017-04-20 DIAGNOSIS — J3089 Other allergic rhinitis: Secondary | ICD-10-CM | POA: Diagnosis not present

## 2017-04-20 DIAGNOSIS — J301 Allergic rhinitis due to pollen: Secondary | ICD-10-CM | POA: Diagnosis not present

## 2017-04-20 DIAGNOSIS — E559 Vitamin D deficiency, unspecified: Secondary | ICD-10-CM | POA: Diagnosis not present

## 2017-04-20 DIAGNOSIS — R5383 Other fatigue: Secondary | ICD-10-CM | POA: Diagnosis not present

## 2017-04-20 DIAGNOSIS — J3081 Allergic rhinitis due to animal (cat) (dog) hair and dander: Secondary | ICD-10-CM | POA: Diagnosis not present

## 2017-04-20 DIAGNOSIS — E538 Deficiency of other specified B group vitamins: Secondary | ICD-10-CM | POA: Diagnosis not present

## 2017-05-04 DIAGNOSIS — J3081 Allergic rhinitis due to animal (cat) (dog) hair and dander: Secondary | ICD-10-CM | POA: Diagnosis not present

## 2017-05-04 DIAGNOSIS — J3089 Other allergic rhinitis: Secondary | ICD-10-CM | POA: Diagnosis not present

## 2017-05-04 DIAGNOSIS — J301 Allergic rhinitis due to pollen: Secondary | ICD-10-CM | POA: Diagnosis not present

## 2017-05-10 DIAGNOSIS — J3089 Other allergic rhinitis: Secondary | ICD-10-CM | POA: Diagnosis not present

## 2017-05-10 DIAGNOSIS — J3081 Allergic rhinitis due to animal (cat) (dog) hair and dander: Secondary | ICD-10-CM | POA: Diagnosis not present

## 2017-05-10 DIAGNOSIS — J301 Allergic rhinitis due to pollen: Secondary | ICD-10-CM | POA: Diagnosis not present

## 2017-05-12 DIAGNOSIS — M25511 Pain in right shoulder: Secondary | ICD-10-CM | POA: Diagnosis not present

## 2017-05-13 DIAGNOSIS — K5909 Other constipation: Secondary | ICD-10-CM | POA: Diagnosis not present

## 2017-05-13 DIAGNOSIS — R1012 Left upper quadrant pain: Secondary | ICD-10-CM | POA: Diagnosis not present

## 2017-05-13 DIAGNOSIS — R1011 Right upper quadrant pain: Secondary | ICD-10-CM | POA: Diagnosis not present

## 2017-05-13 DIAGNOSIS — K5901 Slow transit constipation: Secondary | ICD-10-CM | POA: Diagnosis not present

## 2017-05-13 DIAGNOSIS — K581 Irritable bowel syndrome with constipation: Secondary | ICD-10-CM | POA: Diagnosis not present

## 2017-05-13 DIAGNOSIS — R1084 Generalized abdominal pain: Secondary | ICD-10-CM | POA: Diagnosis not present

## 2017-05-18 DIAGNOSIS — J3081 Allergic rhinitis due to animal (cat) (dog) hair and dander: Secondary | ICD-10-CM | POA: Diagnosis not present

## 2017-05-18 DIAGNOSIS — J301 Allergic rhinitis due to pollen: Secondary | ICD-10-CM | POA: Diagnosis not present

## 2017-05-18 DIAGNOSIS — J3089 Other allergic rhinitis: Secondary | ICD-10-CM | POA: Diagnosis not present

## 2017-05-24 DIAGNOSIS — K5900 Constipation, unspecified: Secondary | ICD-10-CM | POA: Diagnosis not present

## 2017-05-25 DIAGNOSIS — J3081 Allergic rhinitis due to animal (cat) (dog) hair and dander: Secondary | ICD-10-CM | POA: Diagnosis not present

## 2017-05-25 DIAGNOSIS — J3089 Other allergic rhinitis: Secondary | ICD-10-CM | POA: Diagnosis not present

## 2017-05-25 DIAGNOSIS — J301 Allergic rhinitis due to pollen: Secondary | ICD-10-CM | POA: Diagnosis not present

## 2017-06-10 DIAGNOSIS — J301 Allergic rhinitis due to pollen: Secondary | ICD-10-CM | POA: Diagnosis not present

## 2017-06-10 DIAGNOSIS — J3089 Other allergic rhinitis: Secondary | ICD-10-CM | POA: Diagnosis not present

## 2017-06-16 DIAGNOSIS — J3089 Other allergic rhinitis: Secondary | ICD-10-CM | POA: Diagnosis not present

## 2017-06-16 DIAGNOSIS — J301 Allergic rhinitis due to pollen: Secondary | ICD-10-CM | POA: Diagnosis not present

## 2017-06-16 DIAGNOSIS — J3081 Allergic rhinitis due to animal (cat) (dog) hair and dander: Secondary | ICD-10-CM | POA: Diagnosis not present

## 2017-06-27 DIAGNOSIS — J3081 Allergic rhinitis due to animal (cat) (dog) hair and dander: Secondary | ICD-10-CM | POA: Diagnosis not present

## 2017-06-27 DIAGNOSIS — J3089 Other allergic rhinitis: Secondary | ICD-10-CM | POA: Diagnosis not present

## 2017-06-27 DIAGNOSIS — J301 Allergic rhinitis due to pollen: Secondary | ICD-10-CM | POA: Diagnosis not present

## 2017-07-04 DIAGNOSIS — J301 Allergic rhinitis due to pollen: Secondary | ICD-10-CM | POA: Diagnosis not present

## 2017-07-04 DIAGNOSIS — J3089 Other allergic rhinitis: Secondary | ICD-10-CM | POA: Diagnosis not present

## 2017-07-04 DIAGNOSIS — J3081 Allergic rhinitis due to animal (cat) (dog) hair and dander: Secondary | ICD-10-CM | POA: Diagnosis not present

## 2017-07-14 DIAGNOSIS — J3089 Other allergic rhinitis: Secondary | ICD-10-CM | POA: Diagnosis not present

## 2017-07-14 DIAGNOSIS — J301 Allergic rhinitis due to pollen: Secondary | ICD-10-CM | POA: Diagnosis not present

## 2017-07-14 DIAGNOSIS — J3081 Allergic rhinitis due to animal (cat) (dog) hair and dander: Secondary | ICD-10-CM | POA: Diagnosis not present

## 2017-07-29 DIAGNOSIS — J301 Allergic rhinitis due to pollen: Secondary | ICD-10-CM | POA: Diagnosis not present

## 2017-07-29 DIAGNOSIS — J3081 Allergic rhinitis due to animal (cat) (dog) hair and dander: Secondary | ICD-10-CM | POA: Diagnosis not present

## 2017-07-29 DIAGNOSIS — J3089 Other allergic rhinitis: Secondary | ICD-10-CM | POA: Diagnosis not present

## 2017-08-03 DIAGNOSIS — J3081 Allergic rhinitis due to animal (cat) (dog) hair and dander: Secondary | ICD-10-CM | POA: Diagnosis not present

## 2017-08-03 DIAGNOSIS — S5011XA Contusion of right forearm, initial encounter: Secondary | ICD-10-CM | POA: Diagnosis not present

## 2017-08-03 DIAGNOSIS — J3089 Other allergic rhinitis: Secondary | ICD-10-CM | POA: Diagnosis not present

## 2017-08-03 DIAGNOSIS — M79631 Pain in right forearm: Secondary | ICD-10-CM | POA: Diagnosis not present

## 2017-08-03 DIAGNOSIS — J301 Allergic rhinitis due to pollen: Secondary | ICD-10-CM | POA: Diagnosis not present

## 2017-08-03 DIAGNOSIS — M7989 Other specified soft tissue disorders: Secondary | ICD-10-CM | POA: Diagnosis not present

## 2017-08-11 DIAGNOSIS — J3089 Other allergic rhinitis: Secondary | ICD-10-CM | POA: Diagnosis not present

## 2017-08-11 DIAGNOSIS — J3081 Allergic rhinitis due to animal (cat) (dog) hair and dander: Secondary | ICD-10-CM | POA: Diagnosis not present

## 2017-08-11 DIAGNOSIS — J301 Allergic rhinitis due to pollen: Secondary | ICD-10-CM | POA: Diagnosis not present

## 2017-08-24 DIAGNOSIS — J301 Allergic rhinitis due to pollen: Secondary | ICD-10-CM | POA: Diagnosis not present

## 2017-08-25 DIAGNOSIS — J3081 Allergic rhinitis due to animal (cat) (dog) hair and dander: Secondary | ICD-10-CM | POA: Diagnosis not present

## 2017-08-25 DIAGNOSIS — J3089 Other allergic rhinitis: Secondary | ICD-10-CM | POA: Diagnosis not present

## 2017-08-25 DIAGNOSIS — J301 Allergic rhinitis due to pollen: Secondary | ICD-10-CM | POA: Diagnosis not present

## 2017-08-31 DIAGNOSIS — J301 Allergic rhinitis due to pollen: Secondary | ICD-10-CM | POA: Diagnosis not present

## 2017-08-31 DIAGNOSIS — J3089 Other allergic rhinitis: Secondary | ICD-10-CM | POA: Diagnosis not present

## 2017-08-31 DIAGNOSIS — J3081 Allergic rhinitis due to animal (cat) (dog) hair and dander: Secondary | ICD-10-CM | POA: Diagnosis not present

## 2017-09-20 DIAGNOSIS — J3081 Allergic rhinitis due to animal (cat) (dog) hair and dander: Secondary | ICD-10-CM | POA: Diagnosis not present

## 2017-09-20 DIAGNOSIS — J3089 Other allergic rhinitis: Secondary | ICD-10-CM | POA: Diagnosis not present

## 2017-09-20 DIAGNOSIS — J301 Allergic rhinitis due to pollen: Secondary | ICD-10-CM | POA: Diagnosis not present

## 2017-09-21 DIAGNOSIS — Z6827 Body mass index (BMI) 27.0-27.9, adult: Secondary | ICD-10-CM | POA: Diagnosis not present

## 2017-09-21 DIAGNOSIS — Z01419 Encounter for gynecological examination (general) (routine) without abnormal findings: Secondary | ICD-10-CM | POA: Diagnosis not present

## 2017-09-21 IMAGING — CR DG ABDOMEN ACUTE W/ 1V CHEST
3 series · 3 of 3 positions shown · non-contrast
Comparison: None.

CLINICAL DATA: Abdominal pain, nausea and constipation

EXAM:
DG ABDOMEN ACUTE W/ 1V CHEST

[chest pa]
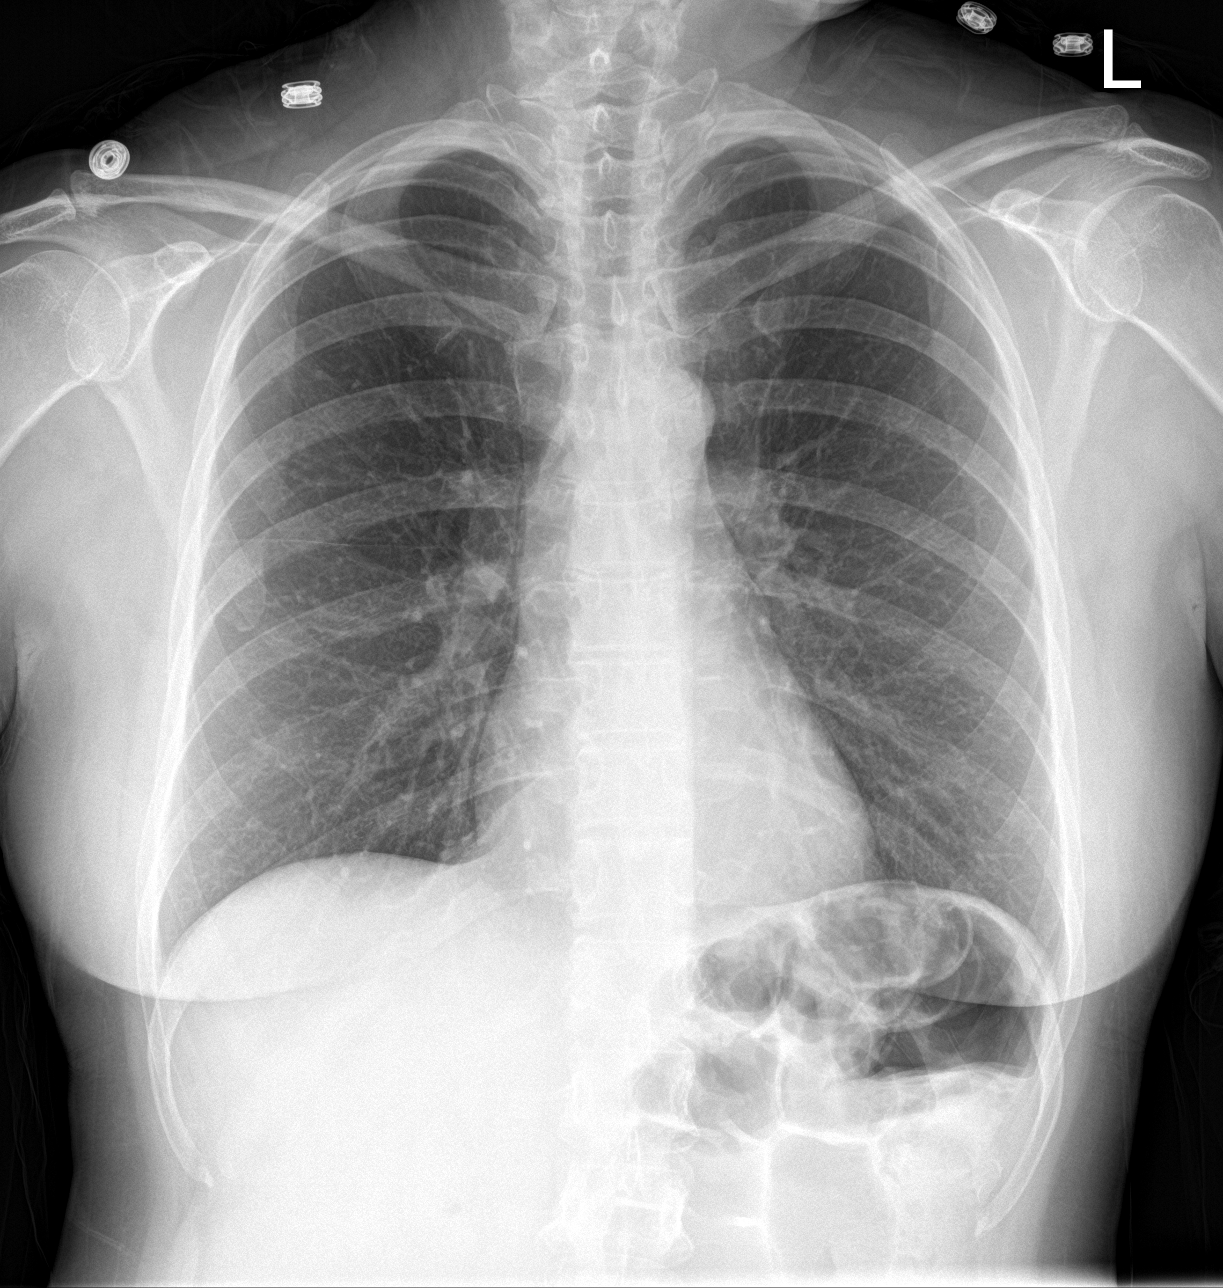

[abdomen erect]
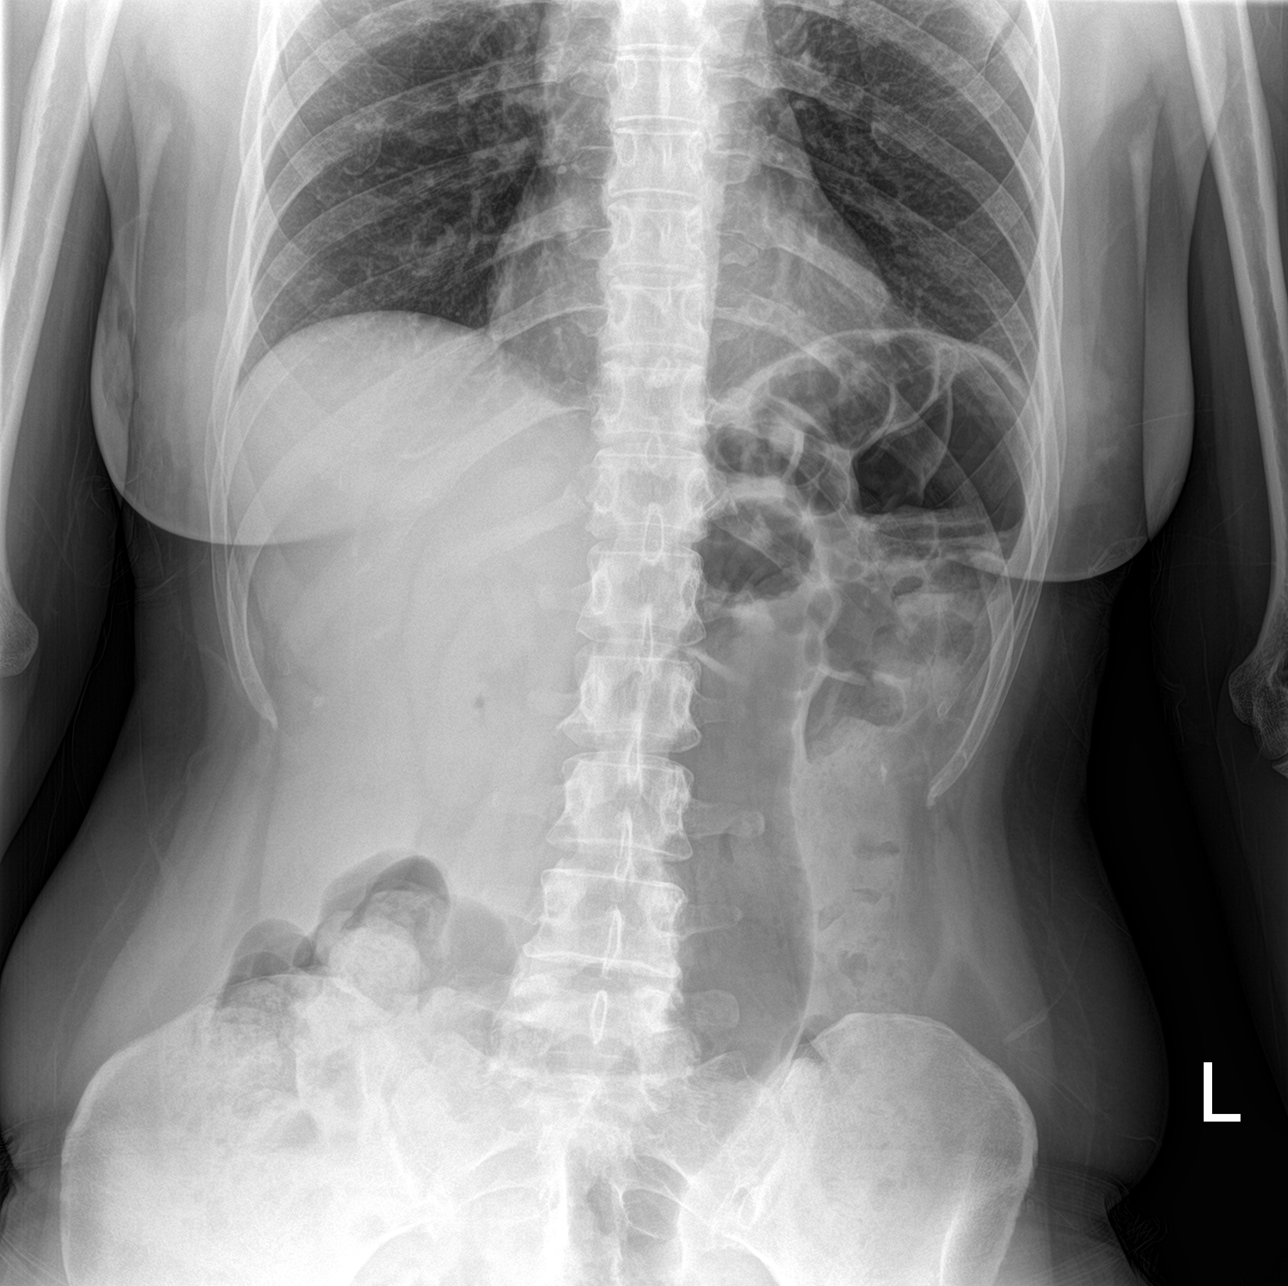

[abdomen supine]
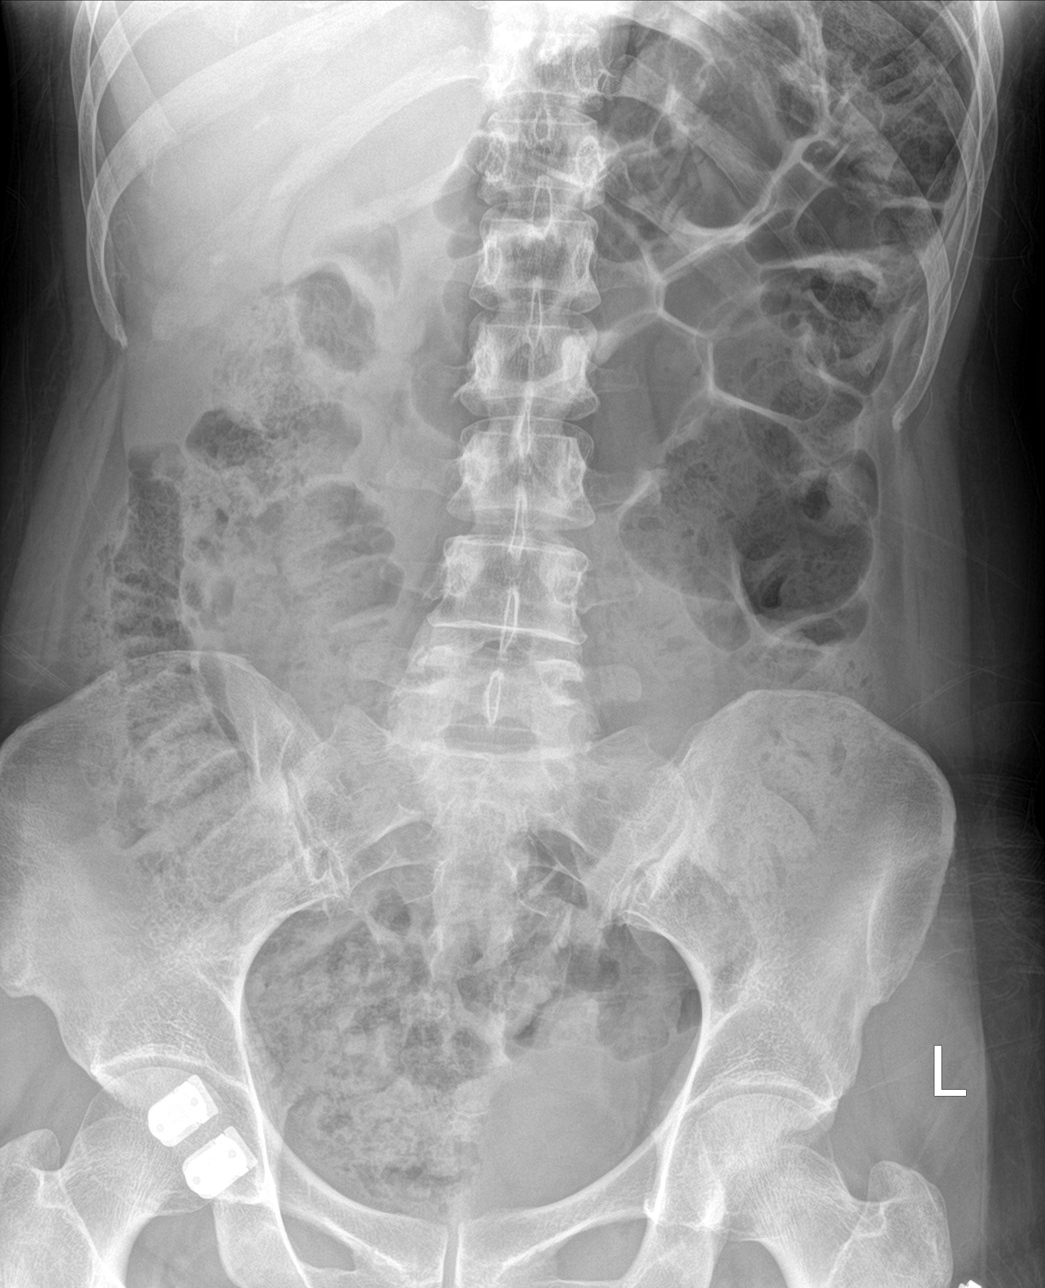

[3 of 3 positions shown; findings below may reference images not displayed]

FINDINGS: Cardiomediastinal contours are normal. No pneumothorax or sizable
pleural effusion.

No focal airspace consolidation or pulmonary edema.

No free intraperitoneal air. On the upright radiograph, there is a
mildly dilated, featureless loop of bowel just to the left of
midline in it is not seen on the supine view. This may be a dilated,
gas-filled loop of small bowel versus redundant sigmoid colon. No
masses or abnormal calcifications.
IMPRESSION: 1. Clear lungs.
2. No free intraperitoneal air.
3. Prominent loop of gas-filled bowel just to the left of midline on
the upright radiograph, possibly mildly dilated small bowel versus
redundant sigmoid colon.

## 2017-09-26 DIAGNOSIS — J3089 Other allergic rhinitis: Secondary | ICD-10-CM | POA: Diagnosis not present

## 2017-09-26 DIAGNOSIS — J3081 Allergic rhinitis due to animal (cat) (dog) hair and dander: Secondary | ICD-10-CM | POA: Diagnosis not present

## 2017-09-26 DIAGNOSIS — J301 Allergic rhinitis due to pollen: Secondary | ICD-10-CM | POA: Diagnosis not present

## 2017-10-17 DIAGNOSIS — J3081 Allergic rhinitis due to animal (cat) (dog) hair and dander: Secondary | ICD-10-CM | POA: Diagnosis not present

## 2017-10-17 DIAGNOSIS — J3089 Other allergic rhinitis: Secondary | ICD-10-CM | POA: Diagnosis not present

## 2017-10-17 DIAGNOSIS — J301 Allergic rhinitis due to pollen: Secondary | ICD-10-CM | POA: Diagnosis not present

## 2017-10-19 DIAGNOSIS — J301 Allergic rhinitis due to pollen: Secondary | ICD-10-CM | POA: Diagnosis not present

## 2017-10-19 DIAGNOSIS — J3081 Allergic rhinitis due to animal (cat) (dog) hair and dander: Secondary | ICD-10-CM | POA: Diagnosis not present

## 2017-10-19 DIAGNOSIS — J3089 Other allergic rhinitis: Secondary | ICD-10-CM | POA: Diagnosis not present

## 2017-10-25 DIAGNOSIS — M542 Cervicalgia: Secondary | ICD-10-CM | POA: Diagnosis not present

## 2017-10-25 DIAGNOSIS — M4722 Other spondylosis with radiculopathy, cervical region: Secondary | ICD-10-CM | POA: Diagnosis not present

## 2017-11-04 DIAGNOSIS — J3089 Other allergic rhinitis: Secondary | ICD-10-CM | POA: Diagnosis not present

## 2017-11-04 DIAGNOSIS — J3081 Allergic rhinitis due to animal (cat) (dog) hair and dander: Secondary | ICD-10-CM | POA: Diagnosis not present

## 2017-11-04 DIAGNOSIS — J301 Allergic rhinitis due to pollen: Secondary | ICD-10-CM | POA: Diagnosis not present

## 2017-11-28 DIAGNOSIS — J301 Allergic rhinitis due to pollen: Secondary | ICD-10-CM | POA: Diagnosis not present

## 2017-11-28 DIAGNOSIS — J3089 Other allergic rhinitis: Secondary | ICD-10-CM | POA: Diagnosis not present

## 2017-11-28 DIAGNOSIS — J3081 Allergic rhinitis due to animal (cat) (dog) hair and dander: Secondary | ICD-10-CM | POA: Diagnosis not present

## 2017-11-29 DIAGNOSIS — Z23 Encounter for immunization: Secondary | ICD-10-CM | POA: Diagnosis not present

## 2017-11-30 DIAGNOSIS — J3081 Allergic rhinitis due to animal (cat) (dog) hair and dander: Secondary | ICD-10-CM | POA: Diagnosis not present

## 2017-11-30 DIAGNOSIS — J301 Allergic rhinitis due to pollen: Secondary | ICD-10-CM | POA: Diagnosis not present

## 2017-11-30 DIAGNOSIS — J3089 Other allergic rhinitis: Secondary | ICD-10-CM | POA: Diagnosis not present

## 2017-12-01 DIAGNOSIS — E538 Deficiency of other specified B group vitamins: Secondary | ICD-10-CM | POA: Diagnosis not present

## 2017-12-05 DIAGNOSIS — J3081 Allergic rhinitis due to animal (cat) (dog) hair and dander: Secondary | ICD-10-CM | POA: Diagnosis not present

## 2017-12-05 DIAGNOSIS — M5412 Radiculopathy, cervical region: Secondary | ICD-10-CM | POA: Diagnosis not present

## 2017-12-05 DIAGNOSIS — J301 Allergic rhinitis due to pollen: Secondary | ICD-10-CM | POA: Diagnosis not present

## 2017-12-05 DIAGNOSIS — H1045 Other chronic allergic conjunctivitis: Secondary | ICD-10-CM | POA: Diagnosis not present

## 2017-12-05 DIAGNOSIS — J3089 Other allergic rhinitis: Secondary | ICD-10-CM | POA: Diagnosis not present

## 2017-12-05 DIAGNOSIS — R05 Cough: Secondary | ICD-10-CM | POA: Diagnosis not present

## 2017-12-08 DIAGNOSIS — J301 Allergic rhinitis due to pollen: Secondary | ICD-10-CM | POA: Diagnosis not present

## 2017-12-08 DIAGNOSIS — J3089 Other allergic rhinitis: Secondary | ICD-10-CM | POA: Diagnosis not present

## 2017-12-08 DIAGNOSIS — M5412 Radiculopathy, cervical region: Secondary | ICD-10-CM | POA: Diagnosis not present

## 2017-12-14 DIAGNOSIS — J3089 Other allergic rhinitis: Secondary | ICD-10-CM | POA: Diagnosis not present

## 2017-12-14 DIAGNOSIS — J301 Allergic rhinitis due to pollen: Secondary | ICD-10-CM | POA: Diagnosis not present

## 2017-12-14 DIAGNOSIS — J3081 Allergic rhinitis due to animal (cat) (dog) hair and dander: Secondary | ICD-10-CM | POA: Diagnosis not present

## 2017-12-14 DIAGNOSIS — M5412 Radiculopathy, cervical region: Secondary | ICD-10-CM | POA: Diagnosis not present

## 2017-12-14 DIAGNOSIS — M542 Cervicalgia: Secondary | ICD-10-CM | POA: Diagnosis not present

## 2017-12-14 DIAGNOSIS — M4722 Other spondylosis with radiculopathy, cervical region: Secondary | ICD-10-CM | POA: Diagnosis not present

## 2017-12-14 DIAGNOSIS — Z719 Counseling, unspecified: Secondary | ICD-10-CM | POA: Diagnosis not present

## 2017-12-19 DIAGNOSIS — Z719 Counseling, unspecified: Secondary | ICD-10-CM | POA: Diagnosis not present

## 2017-12-29 DIAGNOSIS — M5412 Radiculopathy, cervical region: Secondary | ICD-10-CM | POA: Diagnosis not present

## 2017-12-30 DIAGNOSIS — J301 Allergic rhinitis due to pollen: Secondary | ICD-10-CM | POA: Diagnosis not present

## 2017-12-30 DIAGNOSIS — J3089 Other allergic rhinitis: Secondary | ICD-10-CM | POA: Diagnosis not present

## 2017-12-30 DIAGNOSIS — J3081 Allergic rhinitis due to animal (cat) (dog) hair and dander: Secondary | ICD-10-CM | POA: Diagnosis not present

## 2018-01-05 DIAGNOSIS — Z719 Counseling, unspecified: Secondary | ICD-10-CM | POA: Diagnosis not present

## 2018-01-13 DIAGNOSIS — J3089 Other allergic rhinitis: Secondary | ICD-10-CM | POA: Diagnosis not present

## 2018-01-13 DIAGNOSIS — J301 Allergic rhinitis due to pollen: Secondary | ICD-10-CM | POA: Diagnosis not present

## 2018-01-13 DIAGNOSIS — J3081 Allergic rhinitis due to animal (cat) (dog) hair and dander: Secondary | ICD-10-CM | POA: Diagnosis not present

## 2018-01-16 DIAGNOSIS — Z719 Counseling, unspecified: Secondary | ICD-10-CM | POA: Diagnosis not present

## 2018-01-19 DIAGNOSIS — J3089 Other allergic rhinitis: Secondary | ICD-10-CM | POA: Diagnosis not present

## 2018-01-19 DIAGNOSIS — J3081 Allergic rhinitis due to animal (cat) (dog) hair and dander: Secondary | ICD-10-CM | POA: Diagnosis not present

## 2018-01-19 DIAGNOSIS — J301 Allergic rhinitis due to pollen: Secondary | ICD-10-CM | POA: Diagnosis not present

## 2018-01-23 DIAGNOSIS — J3089 Other allergic rhinitis: Secondary | ICD-10-CM | POA: Diagnosis not present

## 2018-01-23 DIAGNOSIS — J301 Allergic rhinitis due to pollen: Secondary | ICD-10-CM | POA: Diagnosis not present

## 2018-01-23 DIAGNOSIS — J3081 Allergic rhinitis due to animal (cat) (dog) hair and dander: Secondary | ICD-10-CM | POA: Diagnosis not present

## 2018-01-23 DIAGNOSIS — Z719 Counseling, unspecified: Secondary | ICD-10-CM | POA: Diagnosis not present

## 2018-01-25 DIAGNOSIS — J3081 Allergic rhinitis due to animal (cat) (dog) hair and dander: Secondary | ICD-10-CM | POA: Diagnosis not present

## 2018-01-25 DIAGNOSIS — J301 Allergic rhinitis due to pollen: Secondary | ICD-10-CM | POA: Diagnosis not present

## 2018-01-25 DIAGNOSIS — J3089 Other allergic rhinitis: Secondary | ICD-10-CM | POA: Diagnosis not present

## 2018-02-01 ENCOUNTER — Other Ambulatory Visit: Payer: Self-pay | Admitting: Gastroenterology

## 2018-02-01 ENCOUNTER — Telehealth: Payer: Self-pay | Admitting: Nurse Practitioner

## 2018-02-01 DIAGNOSIS — R109 Unspecified abdominal pain: Secondary | ICD-10-CM | POA: Diagnosis not present

## 2018-02-01 NOTE — Telephone Encounter (Signed)
Phone call to patient to inform patient of 13hr prep instructions. Pt verbalized understanding. Rx called into pharmacy. Instructions: 0300- 50mg  Prednisone 0900- 50mg  Prednisone 1500- 50mg  Prednisone and 50mg  Benadryl

## 2018-02-02 DIAGNOSIS — J3089 Other allergic rhinitis: Secondary | ICD-10-CM | POA: Diagnosis not present

## 2018-02-02 DIAGNOSIS — J3081 Allergic rhinitis due to animal (cat) (dog) hair and dander: Secondary | ICD-10-CM | POA: Diagnosis not present

## 2018-02-02 DIAGNOSIS — J301 Allergic rhinitis due to pollen: Secondary | ICD-10-CM | POA: Diagnosis not present

## 2018-02-03 ENCOUNTER — Ambulatory Visit
Admission: RE | Admit: 2018-02-03 | Discharge: 2018-02-03 | Disposition: A | Discharge status: Home / Self Care | Payer: 59 | Source: Ambulatory Visit | Attending: Gastroenterology | Admitting: Gastroenterology

## 2018-02-03 DIAGNOSIS — R109 Unspecified abdominal pain: Secondary | ICD-10-CM

## 2018-02-03 DIAGNOSIS — R1012 Left upper quadrant pain: Secondary | ICD-10-CM | POA: Diagnosis not present

## 2018-02-03 MED ORDER — IOPAMIDOL (ISOVUE-300) INJECTION 61%
100.0000 mL | Freq: Once | INTRAVENOUS | Status: AC | PRN
  Administered 2018-02-03: 100 mL via INTRAVENOUS

## 2018-02-20 DIAGNOSIS — Z719 Counseling, unspecified: Secondary | ICD-10-CM | POA: Diagnosis not present

## 2018-02-22 DIAGNOSIS — R1012 Left upper quadrant pain: Secondary | ICD-10-CM | POA: Diagnosis not present

## 2018-02-22 DIAGNOSIS — R140 Abdominal distension (gaseous): Secondary | ICD-10-CM | POA: Diagnosis not present

## 2018-02-22 DIAGNOSIS — K581 Irritable bowel syndrome with constipation: Secondary | ICD-10-CM | POA: Diagnosis not present

## 2018-02-22 DIAGNOSIS — K6389 Other specified diseases of intestine: Secondary | ICD-10-CM | POA: Diagnosis not present

## 2018-02-22 DIAGNOSIS — K5901 Slow transit constipation: Secondary | ICD-10-CM | POA: Diagnosis not present

## 2018-02-23 DIAGNOSIS — J3089 Other allergic rhinitis: Secondary | ICD-10-CM | POA: Diagnosis not present

## 2018-02-23 DIAGNOSIS — J301 Allergic rhinitis due to pollen: Secondary | ICD-10-CM | POA: Diagnosis not present

## 2018-02-23 DIAGNOSIS — J3081 Allergic rhinitis due to animal (cat) (dog) hair and dander: Secondary | ICD-10-CM | POA: Diagnosis not present

## 2018-03-01 DIAGNOSIS — J301 Allergic rhinitis due to pollen: Secondary | ICD-10-CM | POA: Diagnosis not present

## 2018-03-01 DIAGNOSIS — J3089 Other allergic rhinitis: Secondary | ICD-10-CM | POA: Diagnosis not present

## 2018-03-01 DIAGNOSIS — Z719 Counseling, unspecified: Secondary | ICD-10-CM | POA: Diagnosis not present

## 2018-03-01 DIAGNOSIS — J3081 Allergic rhinitis due to animal (cat) (dog) hair and dander: Secondary | ICD-10-CM | POA: Diagnosis not present

## 2018-03-06 DIAGNOSIS — Z719 Counseling, unspecified: Secondary | ICD-10-CM | POA: Diagnosis not present

## 2018-03-08 DIAGNOSIS — J3081 Allergic rhinitis due to animal (cat) (dog) hair and dander: Secondary | ICD-10-CM | POA: Diagnosis not present

## 2018-03-08 DIAGNOSIS — J301 Allergic rhinitis due to pollen: Secondary | ICD-10-CM | POA: Diagnosis not present

## 2018-03-08 DIAGNOSIS — J3089 Other allergic rhinitis: Secondary | ICD-10-CM | POA: Diagnosis not present

## 2018-03-20 DIAGNOSIS — J301 Allergic rhinitis due to pollen: Secondary | ICD-10-CM | POA: Diagnosis not present

## 2018-03-20 DIAGNOSIS — J3089 Other allergic rhinitis: Secondary | ICD-10-CM | POA: Diagnosis not present

## 2018-03-20 DIAGNOSIS — J3081 Allergic rhinitis due to animal (cat) (dog) hair and dander: Secondary | ICD-10-CM | POA: Diagnosis not present

## 2018-03-28 DIAGNOSIS — Z719 Counseling, unspecified: Secondary | ICD-10-CM | POA: Diagnosis not present

## 2018-04-03 DIAGNOSIS — J3089 Other allergic rhinitis: Secondary | ICD-10-CM | POA: Diagnosis not present

## 2018-04-03 DIAGNOSIS — J3081 Allergic rhinitis due to animal (cat) (dog) hair and dander: Secondary | ICD-10-CM | POA: Diagnosis not present

## 2018-04-03 DIAGNOSIS — J301 Allergic rhinitis due to pollen: Secondary | ICD-10-CM | POA: Diagnosis not present

## 2018-04-10 DIAGNOSIS — Z719 Counseling, unspecified: Secondary | ICD-10-CM | POA: Diagnosis not present

## 2018-04-13 DIAGNOSIS — J3089 Other allergic rhinitis: Secondary | ICD-10-CM | POA: Diagnosis not present

## 2018-04-13 DIAGNOSIS — J3081 Allergic rhinitis due to animal (cat) (dog) hair and dander: Secondary | ICD-10-CM | POA: Diagnosis not present

## 2018-04-13 DIAGNOSIS — J301 Allergic rhinitis due to pollen: Secondary | ICD-10-CM | POA: Diagnosis not present

## 2018-04-17 DIAGNOSIS — Z719 Counseling, unspecified: Secondary | ICD-10-CM | POA: Diagnosis not present

## 2018-04-24 DIAGNOSIS — Z719 Counseling, unspecified: Secondary | ICD-10-CM | POA: Diagnosis not present

## 2018-04-26 DIAGNOSIS — J301 Allergic rhinitis due to pollen: Secondary | ICD-10-CM | POA: Diagnosis not present

## 2018-04-26 DIAGNOSIS — J3089 Other allergic rhinitis: Secondary | ICD-10-CM | POA: Diagnosis not present

## 2018-04-26 DIAGNOSIS — J3081 Allergic rhinitis due to animal (cat) (dog) hair and dander: Secondary | ICD-10-CM | POA: Diagnosis not present

## 2018-05-03 DIAGNOSIS — L814 Other melanin hyperpigmentation: Secondary | ICD-10-CM | POA: Diagnosis not present

## 2018-05-03 DIAGNOSIS — L821 Other seborrheic keratosis: Secondary | ICD-10-CM | POA: Diagnosis not present

## 2018-05-03 DIAGNOSIS — D225 Melanocytic nevi of trunk: Secondary | ICD-10-CM | POA: Diagnosis not present

## 2018-05-11 DIAGNOSIS — J3081 Allergic rhinitis due to animal (cat) (dog) hair and dander: Secondary | ICD-10-CM | POA: Diagnosis not present

## 2018-05-11 DIAGNOSIS — J3089 Other allergic rhinitis: Secondary | ICD-10-CM | POA: Diagnosis not present

## 2018-05-11 DIAGNOSIS — J301 Allergic rhinitis due to pollen: Secondary | ICD-10-CM | POA: Diagnosis not present

## 2018-05-15 DIAGNOSIS — Z719 Counseling, unspecified: Secondary | ICD-10-CM | POA: Diagnosis not present

## 2018-05-22 DIAGNOSIS — J3081 Allergic rhinitis due to animal (cat) (dog) hair and dander: Secondary | ICD-10-CM | POA: Diagnosis not present

## 2018-05-22 DIAGNOSIS — J3089 Other allergic rhinitis: Secondary | ICD-10-CM | POA: Diagnosis not present

## 2018-05-22 DIAGNOSIS — J301 Allergic rhinitis due to pollen: Secondary | ICD-10-CM | POA: Diagnosis not present

## 2018-05-25 DIAGNOSIS — J301 Allergic rhinitis due to pollen: Secondary | ICD-10-CM | POA: Diagnosis not present

## 2018-05-26 DIAGNOSIS — J3089 Other allergic rhinitis: Secondary | ICD-10-CM | POA: Diagnosis not present

## 2018-05-26 DIAGNOSIS — J3081 Allergic rhinitis due to animal (cat) (dog) hair and dander: Secondary | ICD-10-CM | POA: Diagnosis not present

## 2018-05-31 DIAGNOSIS — R7301 Impaired fasting glucose: Secondary | ICD-10-CM | POA: Diagnosis not present

## 2018-05-31 DIAGNOSIS — G479 Sleep disorder, unspecified: Secondary | ICD-10-CM | POA: Diagnosis not present

## 2018-06-02 DIAGNOSIS — J3089 Other allergic rhinitis: Secondary | ICD-10-CM | POA: Diagnosis not present

## 2018-06-02 DIAGNOSIS — J3081 Allergic rhinitis due to animal (cat) (dog) hair and dander: Secondary | ICD-10-CM | POA: Diagnosis not present

## 2018-06-02 DIAGNOSIS — J301 Allergic rhinitis due to pollen: Secondary | ICD-10-CM | POA: Diagnosis not present

## 2018-06-16 DIAGNOSIS — J3089 Other allergic rhinitis: Secondary | ICD-10-CM | POA: Diagnosis not present

## 2018-06-16 DIAGNOSIS — J301 Allergic rhinitis due to pollen: Secondary | ICD-10-CM | POA: Diagnosis not present

## 2018-06-16 DIAGNOSIS — J3081 Allergic rhinitis due to animal (cat) (dog) hair and dander: Secondary | ICD-10-CM | POA: Diagnosis not present

## 2018-06-21 DIAGNOSIS — J3089 Other allergic rhinitis: Secondary | ICD-10-CM | POA: Diagnosis not present

## 2018-06-21 DIAGNOSIS — J3081 Allergic rhinitis due to animal (cat) (dog) hair and dander: Secondary | ICD-10-CM | POA: Diagnosis not present

## 2018-06-21 DIAGNOSIS — J301 Allergic rhinitis due to pollen: Secondary | ICD-10-CM | POA: Diagnosis not present

## 2018-06-28 DIAGNOSIS — J3081 Allergic rhinitis due to animal (cat) (dog) hair and dander: Secondary | ICD-10-CM | POA: Diagnosis not present

## 2018-06-28 DIAGNOSIS — J3089 Other allergic rhinitis: Secondary | ICD-10-CM | POA: Diagnosis not present

## 2018-06-28 DIAGNOSIS — J301 Allergic rhinitis due to pollen: Secondary | ICD-10-CM | POA: Diagnosis not present

## 2018-07-05 DIAGNOSIS — J3089 Other allergic rhinitis: Secondary | ICD-10-CM | POA: Diagnosis not present

## 2018-07-05 DIAGNOSIS — J3081 Allergic rhinitis due to animal (cat) (dog) hair and dander: Secondary | ICD-10-CM | POA: Diagnosis not present

## 2018-07-05 DIAGNOSIS — J301 Allergic rhinitis due to pollen: Secondary | ICD-10-CM | POA: Diagnosis not present

## 2018-07-07 DIAGNOSIS — J3081 Allergic rhinitis due to animal (cat) (dog) hair and dander: Secondary | ICD-10-CM | POA: Diagnosis not present

## 2018-07-07 DIAGNOSIS — J301 Allergic rhinitis due to pollen: Secondary | ICD-10-CM | POA: Diagnosis not present

## 2018-07-07 DIAGNOSIS — J3089 Other allergic rhinitis: Secondary | ICD-10-CM | POA: Diagnosis not present

## 2018-07-13 DIAGNOSIS — R21 Rash and other nonspecific skin eruption: Secondary | ICD-10-CM | POA: Diagnosis not present

## 2018-07-18 DIAGNOSIS — J3089 Other allergic rhinitis: Secondary | ICD-10-CM | POA: Diagnosis not present

## 2018-07-18 DIAGNOSIS — J3081 Allergic rhinitis due to animal (cat) (dog) hair and dander: Secondary | ICD-10-CM | POA: Diagnosis not present

## 2018-07-18 DIAGNOSIS — J301 Allergic rhinitis due to pollen: Secondary | ICD-10-CM | POA: Diagnosis not present

## 2018-07-20 DIAGNOSIS — J301 Allergic rhinitis due to pollen: Secondary | ICD-10-CM | POA: Diagnosis not present

## 2018-07-20 DIAGNOSIS — J3081 Allergic rhinitis due to animal (cat) (dog) hair and dander: Secondary | ICD-10-CM | POA: Diagnosis not present

## 2018-07-20 DIAGNOSIS — J3089 Other allergic rhinitis: Secondary | ICD-10-CM | POA: Diagnosis not present

## 2018-07-28 DIAGNOSIS — J3081 Allergic rhinitis due to animal (cat) (dog) hair and dander: Secondary | ICD-10-CM | POA: Diagnosis not present

## 2018-07-28 DIAGNOSIS — J3089 Other allergic rhinitis: Secondary | ICD-10-CM | POA: Diagnosis not present

## 2018-07-28 DIAGNOSIS — J301 Allergic rhinitis due to pollen: Secondary | ICD-10-CM | POA: Diagnosis not present

## 2018-10-25 ENCOUNTER — Encounter: Payer: Self-pay | Admitting: Cardiology

## 2018-10-25 DIAGNOSIS — A6000 Herpesviral infection of urogenital system, unspecified: Secondary | ICD-10-CM

## 2018-10-25 HISTORY — DX: Herpesviral infection of urogenital system, unspecified: A60.00

## 2018-10-27 ENCOUNTER — Encounter: Payer: Self-pay | Admitting: Cardiology

## 2018-10-27 ENCOUNTER — Other Ambulatory Visit: Payer: Self-pay

## 2018-10-27 ENCOUNTER — Ambulatory Visit (INDEPENDENT_AMBULATORY_CARE_PROVIDER_SITE_OTHER): Payer: 59 | Admitting: Cardiology

## 2018-10-27 VITALS — BP 100/68 | HR 58 | Ht 63.0 in | Wt 144.0 lb

## 2018-10-27 DIAGNOSIS — Z13220 Encounter for screening for lipoid disorders: Secondary | ICD-10-CM | POA: Diagnosis not present

## 2018-10-27 DIAGNOSIS — E119 Type 2 diabetes mellitus without complications: Secondary | ICD-10-CM | POA: Insufficient documentation

## 2018-10-27 DIAGNOSIS — Z1329 Encounter for screening for other suspected endocrine disorder: Secondary | ICD-10-CM | POA: Diagnosis not present

## 2018-10-27 DIAGNOSIS — R011 Cardiac murmur, unspecified: Secondary | ICD-10-CM

## 2018-10-27 DIAGNOSIS — R079 Chest pain, unspecified: Secondary | ICD-10-CM

## 2018-10-27 DIAGNOSIS — R0789 Other chest pain: Secondary | ICD-10-CM

## 2018-10-27 HISTORY — DX: Cardiac murmur, unspecified: R01.1

## 2018-10-27 NOTE — Patient Instructions (Addendum)
Medication Instructions:  Your physician recommends that you continue on your current medications as directed. Please refer to the Current Medication list given to you today.  If you need a refill on your cardiac medications before your next appointment, please call your pharmacy.   Lab work: Your physician recommends that you return next week FASTING for BMP, CBC, TSH, hepatic and lipid panel   If you have labs (blood work) drawn today and your tests are completely normal, you will receive your results only by: Marland Kitchen MyChart Message (if you have MyChart) OR . A paper copy in the mail If you have any lab test that is abnormal or we need to change your treatment, we will call you to review the results.  Testing/Procedures: You had an EKG performed today.  Your physician has requested that you have an echocardiogram. Echocardiography is a painless test that uses sound waves to create images of your heart. It provides your doctor with information about the size and shape of your heart and how well your heart's chambers and valves are working. This procedure takes approximately one hour. There are no restrictions for this procedure.  Your physician has requested that you have a lexiscan myoview. For further information please visit [email or web address]. Please follow instruction sheet, as given.    Follow-Up: At [facility], you and your health needs are our priority.  As part of our continuing mission to provide you with exceptional heart care, we have created designated Provider Care Teams.  These Care Teams include your primary Cardiologist (physician) and Advanced Practice Providers (APPs -  Physician Assistants and Nurse Practitioners) who all work together to provide you with the care you need, when you need it. You will need a follow up appointment in 4 months.   Any Other Special Instructions Will Be Listed Below  Regadenoson injection What is this medicine? REGADENOSON is used to  test the heart for coronary artery disease. It is used in patients who can not exercise for their stress test. This medicine may be used for other purposes; ask your health care provider or pharmacist if you have questions. COMMON BRAND NAME(S): Lexiscan What should I tell my health care provider before I take this medicine? They need to know if you have any of these conditions:  heart problems  lung or breathing disease, like asthma or COPD  an unusual or allergic reaction to regadenoson, other medicines, foods, dyes, or preservatives  pregnant or trying to get pregnant  breast-feeding How should I use this medicine? This medicine is for injection into a vein. It is given by a health care professional in a hospital or clinic setting. Talk to your pediatrician regarding the use of this medicine in children. Special care may be needed. Overdosage: If you think you have taken too much of this medicine contact a poison control center or emergency room at once. NOTE: This medicine is only for you. Do not share this medicine with others. What if I miss a dose? This does not apply. What may interact with this medicine?  caffeine  dipyridamole  guarana  theophylline This list may not describe all possible interactions. Give your health care provider a list of all the medicines, herbs, non-prescription drugs, or dietary supplements you use. Also tell them if you smoke, drink alcohol, or use illegal drugs. Some items may interact with your medicine. What should I watch for while using this medicine? Your condition will be monitored carefully while you are receiving this medicine. Do  not take medicines, foods, or drinks with caffeine (like coffee, tea, or colas) for at least 12 hours before your test. If you do not know if something contains caffeine, ask your health care professional. What side effects may I notice from receiving this medicine? Side effects that you should report to your  doctor or health care professional as soon as possible:  allergic reactions like skin rash, itching or hives, swelling of the face, lips, or tongue  breathing problems  chest pain, tightness or palpitations  severe headache Side effects that usually do not require medical attention (report to your doctor or health care professional if they continue or are bothersome):  flushing  headache  irritation or pain at site where injected  nausea, vomiting This list may not describe all possible side effects. Call your doctor for medical advice about side effects. You may report side effects to FDA at 1-800-FDA-1088. Where should I keep my medicine? This drug is given in a hospital or clinic and will not be stored at home. NOTE: This sheet is a summary. It may not cover all possible information. If you have questions about this medicine, talk to your doctor, pharmacist, or health care provider.  2020 Elsevier/Gold Standard (2007-11-06 15:08:13)  Cardiac Nuclear Scan A cardiac nuclear scan is a test that is done to check the flow of blood to your heart. It is done when you are resting and when you are exercising. The test looks for problems such as:  Not enough blood reaching a portion of the heart.  The heart muscle not working as it should. You may need this test if:  You have heart disease.  You have had lab results that are not normal.  You have had heart surgery or a balloon procedure to open up blocked arteries (angioplasty).  You have chest pain.  You have shortness of breath. In this test, a special dye (tracer) is put into your bloodstream. The tracer will travel to your heart. A camera will then take pictures of your heart to see how the tracer moves through your heart. This test is usually done at a hospital and takes 2-4 hours. Tell a doctor about:  Any allergies you have.  All medicines you are taking, including vitamins, herbs, eye drops, creams, and  over-the-counter medicines.  Any problems you or family members have had with anesthetic medicines.  Any blood disorders you have.  Any surgeries you have had.  Any medical conditions you have.  Whether you are pregnant or may be pregnant. What are the risks? Generally, this is a safe test. However, problems may occur, such as:  Serious chest pain and heart attack. This is only a risk if the stress portion of the test is done.  Rapid heartbeat.  A feeling of warmth in your chest. This feeling usually does not last long.  Allergic reaction to the tracer. What happens before the test?  Ask your doctor about changing or stopping your normal medicines. This is important.  Follow instructions from your doctor about what you cannot eat or drink.  Remove your jewelry on the day of the test. What happens during the test?  An IV tube will be inserted into one of your veins.  Your doctor will give you a small amount of tracer through the IV tube.  You will wait for 20-40 minutes while the tracer moves through your bloodstream.  Your heart will be monitored with an electrocardiogram (ECG).  You will lie down on  an exam table.  Pictures of your heart will be taken for about 15-20 minutes.  You may also have a stress test. For this test, one of these things may be done: ? You will be asked to exercise on a treadmill or a stationary bike. ? You will be given medicines that will make your heart work harder. This is done if you are unable to exercise.  When blood flow to your heart has peaked, a tracer will again be given through the IV tube.  After 20-40 minutes, you will get back on the exam table. More pictures will be taken of your heart.  Depending on the tracer that is used, more pictures may need to be taken 3-4 hours later.  Your IV tube will be removed when the test is over. The test may vary among doctors and hospitals. What happens after the test?  Ask your  doctor: ? Whether you can return to your normal schedule, including diet, activities, and medicines. ? Whether you should drink more fluids. This will help to remove the tracer from your body. Drink enough fluid to keep your pee (urine) pale yellow.  Ask your doctor, or the department that is doing the test: ? When will my results be ready? ? How will I get my results? Summary  A cardiac nuclear scan is a test that is done to check the flow of blood to your heart.  Tell your doctor whether you are pregnant or may be pregnant.  Before the test, ask your doctor about changing or stopping your normal medicines. This is important.  Ask your doctor whether you can return to your normal activities. You may be asked to drink more fluids. This information is not intended to replace advice given to you by your health care provider. Make sure you discuss any questions you have with your health care provider. Document Released: 08/22/2017 Document Revised: 06/28/2018 Document Reviewed: 08/22/2017 Elsevier Patient Education  [place].  Echocardiogram An echocardiogram is a procedure that uses painless sound waves (ultrasound) to produce an image of the heart. Images from an echocardiogram can provide important information about:  Signs of coronary artery disease (CAD).  Aneurysm detection. An aneurysm is a weak or damaged part of an artery wall that bulges out from the normal force of blood pumping through the body.  Heart size and shape. Changes in the size or shape of the heart can be associated with certain conditions, including heart failure, aneurysm, and CAD.  Heart muscle function.  Heart valve function.  Signs of a past heart attack.  Fluid buildup around the heart.  Thickening of the heart muscle.  A tumor or infectious growth around the heart valves. Tell a health care provider about:  Any allergies you have.  All medicines you are taking, including vitamins,  herbs, eye drops, creams, and over-the-counter medicines.  Any blood disorders you have.  Any surgeries you have had.  Any medical conditions you have.  Whether you are pregnant or may be pregnant. What are the risks? Generally, this is a safe procedure. However, problems may occur, including:  Allergic reaction to dye (contrast) that may be used during the procedure. What happens before the procedure? No specific preparation is needed. You may eat and drink normally. What happens during the procedure?   An IV tube may be inserted into one of your veins.  You may receive contrast through this tube. A contrast is an injection that improves the quality of the  pictures from your heart.  A gel will be applied to your chest.  A wand-like tool (transducer) will be moved over your chest. The gel will help to transmit the sound waves from the transducer.  The sound waves will harmlessly bounce off of your heart to allow the heart images to be captured in real-time motion. The images will be recorded on a computer. The procedure may vary among health care providers and hospitals. What happens after the procedure?  You may return to your normal, everyday life, including diet, activities, and medicines, unless your health care provider tells you not to do that. Summary  An echocardiogram is a procedure that uses painless sound waves (ultrasound) to produce an image of the heart.  Images from an echocardiogram can provide important information about the size and shape of your heart, heart muscle function, heart valve function, and fluid buildup around your heart.  You do not need to do anything to prepare before this procedure. You may eat and drink normally.  After the echocardiogram is completed, you may return to your normal, everyday life, unless your health care provider tells you not to do that. This information is not intended to replace advice given to you by your health care  provider. Make sure you discuss any questions you have with your health care provider. Document Released: 03/05/2000 Document Revised: 06/29/2018 Document Reviewed: 04/10/2016 Elsevier Patient Education  2020 [employer].

## 2018-10-27 NOTE — Addendum Note (Signed)
Addended by: Beckey Rutter on: 10/27/2018 04:29 PM   Modules accepted: Orders

## 2018-10-27 NOTE — Progress Notes (Signed)
Cardiology Office Note:    Date:  10/27/2018   ID:  [name], DOB May 06, 1965, MRN [id number]  PCP:  Aretta Nip, MD  Cardiologist:  Jenean Lindau, MD   Referring MD: Aretta Nip, MD    ASSESSMENT:    1. Chest discomfort   2. Diet-controlled diabetes mellitus (HCC)   3. Cardiac murmur   4. Screening cholesterol level    PLAN:    In order of problems listed above:  1. Chest discomfort: Primary prevention stressed with the patient.  Importance of compliance with diet and medication stressed and she vocalized understanding. 2. Low blood pressure: I asked her to keep herself well-hydrated with extra salt and water in the diet especially in this part months and she vocalized understanding and agreeable 3. Diet-controlled diabetes mellitus: Diet was discussed and I would like to check her blood work including fasting lipids she needs to be on statin therapy. 4. Patient will be seen in follow-up appointment in 6 months or earlier if the patient has any concerns 5. Echocardiogram will be done to assess murmur heard on auscultation.   Medication Adjustments/Labs and Tests Ordered: Current medicines are reviewed at length with the patient today.  Concerns regarding medicines are outlined above.  No orders of the defined types were placed in this encounter.  No orders of the defined types were placed in this encounter.    History of Present Illness:    Deborah [name] is a 53 y.o. female who is being seen today for the evaluation of chest discomfort at the request of Rankins, Bill Salinas, MD.  Patient is a pleasant 53 year old female.  She has past medical history of diet-controlled diabetes mellitus.  She mentions to me that she experiences chest tightness pretty much all day long.  No orthopnea or PND.  She with exercise the chest tightness does not get worse.  For this reason she is here.  No radiation of the symptoms to the neck or to the arms.  At the  time of my evaluation, the patient is alert awake oriented and in no distress.  She has low blood pressure and gives me history suggesting of some postural hypotension at times.  Past Medical History:  Diagnosis Date  . Abdominal pain, generalized 11/28/2009   Qualifier: Diagnosis of  By: Trellis Paganini PA-c, Amy S   . Abdominal pain, left upper quadrant 05/29/2008   Qualifier: Diagnosis of  By: Nelson-Smith CMA (AAMA), Dottie    . ABDOMINAL PAIN-EPIGASTRIC 11/28/2009   Qualifier: Diagnosis of  By: Trellis Paganini PA-c, Amy S   . Abnormal weight gain 11/28/2009   Qualifier: Diagnosis of  By: Trellis Paganini PA-c, Amy S   . B12 DEFICIENCY 12/20/2008   Qualifier: Diagnosis of  By: Marland Mcalpine    . Chronic constipation   . Chronic pancreatitis ([place]) LOW-GRADE   FOLLOWED BY DR Sydell Axon BRODIE  . CONSTIPATION 11/28/2009   Qualifier: Diagnosis of  By: Trellis Paganini PA-c, Amy S   . GENITAL HERPES 05/11/2007   Qualifier: Diagnosis of  By: Mickle Asper, Ramond Craver    . Genital herpes in women   . Hot flashes   . IBS (irritable bowel syndrome)   . Irritable bowel syndrome 05/11/2007   Qualifier: Diagnosis of  By: Arsenio Loader RN, Ramond Craver    . Menorrhagia   . Nausea alone 11/28/2009   Qualifier: Diagnosis of  By: Trellis Paganini PA-c, Amy S   . PANCREATITIS 05/11/2007   Qualifier: Diagnosis of  By: Arsenio Loader RN,  Carissa    . Perimenopausal   . SCOLIOSIS 05/23/2008   Qualifier: Diagnosis of  By: Harlon Ditty CMA (AAMA), Dottie    . Seasonal allergies   . Thrombocytosis ([place]) 07/13/2016    Past Surgical History:  Procedure Laterality Date  . ABLATION  2014  . APPENDECTOMY  5/21/197   Incidental appy  . KNEE ARTHROSCOPY  2004   RIGHT  . 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 Cholecalciferol (VITAMIN D3) 1000 units CAPS Take 1,000 tablets by mouth daily.   . Cyanocobalamin (B-12 COMPLIANCE INJECTION IJ) Inject as directed.  Mariane Baumgarten Calcium (STOOL SOFTENER PO) Take 1 tablet by mouth 2 (two) times daily.   . fluticasone (FLONASE) 50 MCG/ACT nasal spray Place 2 sprays into the nose daily.  Marland Kitchen levocetirizine (XYZAL) 5 MG tablet Take 5 mg by mouth every evening.  . montelukast (SINGULAIR) 10 MG tablet Take 10 mg by mouth at bedtime.  . valACYclovir (VALTREX) 500 MG tablet Take 500 mg by mouth daily.  Marland Kitchen zolpidem (AMBIEN) 5 MG tablet TK 1 T PO NIGHTLY     Allergies:   Ivp dye [iodinated diagnostic agents], Ortho tri-cyclen [norgestimate-eth estradiol], and Penicillins   Social History   Socioeconomic History  . Marital status: Single    Spouse name: Not on file  . Number of children: Not on file  . Years of education: Not on file  . Highest education level: Not on file  Occupational History  . Occupation: Licensed conveyancer  Social Needs  . Financial resource strain: Not on file  . Food insecurity    Worry: Not on file    Inability: Not on file  . Transportation needs    Medical: Not on file    Non-medical: Not on file  Tobacco Use  . Smoking status: Never Smoker  . Smokeless tobacco: Never Used  Substance and Sexual Activity  . Alcohol use: No  . Drug use: No  . Sexual activity: Not on file  Lifestyle  . Physical activity    Days per week: Not on file    Minutes per session: Not on file  . Stress: Not on file  Relationships  . Social Herbalist on phone: Not on file    Gets together: Not on file    Attends religious service: Not on file    Active member of club or organization: Not on file    Attends meetings of clubs or organizations: Not on file    Relationship status: Not on file  Other Topics Concern  . Not on file  Social History Narrative  . Not on file     Family History: The patient's family history includes CAD in her father; Cancer in her father and paternal grandmother; Diverticulitis in her father; Ulcerative colitis in her mother.  ROS:   Please see the history of  present illness.    All other systems reviewed and are negative.  EKGs/Labs/Other Studies Reviewed:    The following studies were reviewed today: EKG reveals sinus rhythm and nonspecific ST-T changes   Recent Labs: No results found for requested labs within last 8760 hours.  Recent Lipid Panel No results found for: CHOL, TRIG, HDL, CHOLHDL, VLDL, LDLCALC, LDLDIRECT  Physical Exam:    VS:  BP 100/68   Pulse (!) 58   Ht 5\' 3"  (1.6 m)   Wt 144 lb (  65.3 kg)   SpO2 98%   BMI 25.51 kg/m     Wt Readings from Last 3 Encounters:  10/27/18 144 lb (65.3 kg)  07/13/16 134 lb 3.2 oz (60.9 kg)  02/02/16 132 lb 9.6 oz (60.1 kg)     GEN: Patient is in no acute distress HEENT: Normal NECK: No JVD; No carotid bruits LYMPHATICS: No lymphadenopathy CARDIAC: S1 S2 regular, 2/6 systolic murmur at the apex. RESPIRATORY:  Clear to auscultation without rales, wheezing or rhonchi  ABDOMEN: Soft, non-tender, non-distended MUSCULOSKELETAL:  No edema; No deformity  SKIN: Warm and dry NEUROLOGIC:  Alert and oriented x 3 PSYCHIATRIC:  Normal affect    Signed, Jenean Lindau, MD  10/27/2018 4:18 PM    [facility]

## 2018-10-31 ENCOUNTER — Telehealth (HOSPITAL_COMMUNITY): Payer: Self-pay | Admitting: *Deleted

## 2018-10-31 NOTE — Telephone Encounter (Signed)
Patient given detailed instructions per Myocardial Perfusion Study Information Sheet for the test on 11/03/18 at  7:30. Patient notified to arrive 15 minutes early and that it is imperative to arrive on time for appointment to keep from having the test rescheduled.  If you need to cancel or reschedule your appointment, please call the office within 24 hours of your appointment. . Patient verbalized understanding.Deborah [name]

## 2018-11-02 ENCOUNTER — Ambulatory Visit (HOSPITAL_BASED_OUTPATIENT_CLINIC_OR_DEPARTMENT_OTHER)
Admission: RE | Admit: 2018-11-02 | Discharge: 2018-11-02 | Disposition: A | Discharge status: Home / Self Care | Payer: 59 | Source: Ambulatory Visit | Attending: Cardiology | Admitting: Cardiology

## 2018-11-02 ENCOUNTER — Other Ambulatory Visit: Payer: Self-pay

## 2018-11-02 DIAGNOSIS — R011 Cardiac murmur, unspecified: Secondary | ICD-10-CM | POA: Diagnosis present

## 2018-11-02 LAB — LIPID PANEL
Chol/HDL Ratio: 3.4 ratio (ref 0.0–4.4)
Cholesterol, Total: 211 mg/dL — ABNORMAL HIGH (ref 100–199)
HDL: 62 mg/dL (ref >39)
LDL Calculated: 135 mg/dL — ABNORMAL HIGH (ref 0–99)
Triglycerides: 72 mg/dL (ref 0–149)
VLDL Cholesterol Cal: 14 mg/dL (ref 5–40)

## 2018-11-02 LAB — CBC
Hematocrit: 40.3 % (ref 34.0–46.6)
Hemoglobin: 13.6 g/dL (ref 11.1–15.9)
MCH: 34.5 pg — ABNORMAL HIGH (ref 26.6–33.0)
MCHC: 33.7 g/dL (ref 31.5–35.7)
MCV: 102 fL — ABNORMAL HIGH (ref 79–97)
Platelets: 494 10*3/uL — ABNORMAL HIGH (ref 150–450)
RBC: 3.94 x10E6/uL (ref 3.77–5.28)
RDW: 13.2 % (ref 11.7–15.4)
WBC: 11.7 10*3/uL — ABNORMAL HIGH (ref 3.4–10.8)

## 2018-11-02 LAB — BASIC METABOLIC PANEL
BUN/Creatinine Ratio: 18 (ref 9–23)
BUN: 14 mg/dL (ref 6–24)
CO2: 23 mmol/L (ref 20–29)
Calcium: 9.6 mg/dL (ref 8.7–10.2)
Chloride: 100 mmol/L (ref 96–106)
Creatinine, Ser: 0.77 mg/dL (ref 0.57–1.00)
GFR calc Af Amer: 103 mL/min/{1.73_m2} (ref >59)
GFR calc non Af Amer: 89 mL/min/{1.73_m2} (ref >59)
Glucose: 88 mg/dL (ref 65–99)
Potassium: 4.2 mmol/L (ref 3.5–5.2)
Sodium: 137 mmol/L (ref 134–144)

## 2018-11-02 LAB — HEPATIC FUNCTION PANEL
ALT: 23 IU/L (ref 0–32)
AST: 24 IU/L (ref 0–40)
Albumin: 4.1 g/dL (ref 3.8–4.9)
Alkaline Phosphatase: 90 IU/L (ref 39–117)
Bilirubin Total: 0.4 mg/dL (ref 0.0–1.2)
Bilirubin, Direct: 0.11 mg/dL (ref 0.00–0.40)
Total Protein: 6.6 g/dL (ref 6.0–8.5)

## 2018-11-02 LAB — TSH: TSH: 4.18 u[IU]/mL (ref 0.450–4.500)

## 2018-11-02 NOTE — Progress Notes (Signed)
  Echocardiogram 2D Echocardiogram has been performed.  Deborah [name] 11/02/2018, 11:55 AM

## 2018-11-03 ENCOUNTER — Ambulatory Visit (HOSPITAL_COMMUNITY): Payer: 59 | Attending: Cardiology

## 2018-11-03 ENCOUNTER — Telehealth: Payer: Self-pay | Admitting: *Deleted

## 2018-11-03 DIAGNOSIS — R0789 Other chest pain: Secondary | ICD-10-CM | POA: Insufficient documentation

## 2018-11-03 DIAGNOSIS — R079 Chest pain, unspecified: Secondary | ICD-10-CM | POA: Diagnosis present

## 2018-11-03 DIAGNOSIS — E785 Hyperlipidemia, unspecified: Secondary | ICD-10-CM

## 2018-11-03 LAB — MYOCARDIAL PERFUSION IMAGING
LV dias vol: 70 mL (ref 46–106)
LV sys vol: 28 mL
Peak HR: 131 {beats}/min
Rest HR: 56 {beats}/min
SDS: 4
SRS: 0
SSS: 4
TID: 1.05

## 2018-11-03 MED ORDER — TECHNETIUM TC 99M TETROFOSMIN IV KIT
10.1000 | PACK | Freq: Once | INTRAVENOUS | Status: AC | PRN
  Administered 2018-11-03: 10.1 via INTRAVENOUS
  Filled 2018-11-03: qty 11

## 2018-11-03 MED ORDER — TECHNETIUM TC 99M TETROFOSMIN IV KIT
30.4000 | PACK | Freq: Once | INTRAVENOUS | Status: AC | PRN
  Administered 2018-11-03: 30.4 via INTRAVENOUS
  Filled 2018-11-03: qty 31

## 2018-11-03 MED ORDER — REGADENOSON 0.4 MG/5ML IV SOLN
0.4000 mg | Freq: Once | INTRAVENOUS | Status: AC
  Administered 2018-11-03: 0.4 mg via INTRAVENOUS

## 2018-11-03 MED ORDER — ATORVASTATIN CALCIUM 10 MG PO TABS: 10.0000 mg | ORAL_TABLET | Freq: Every day | ORAL | 1 refills | Status: DC

## 2018-11-03 NOTE — Telephone Encounter (Signed)
Telephone call to patient. Informed of lab results and need to start Lipitor 10 mg daily,follow a low fat diet, and recheck lipid and liver levels in 6 weeks. Pt verbalized understanding.

## 2018-11-06 ENCOUNTER — Telehealth: Payer: Self-pay | Admitting: Hematology and Oncology

## 2018-11-06 NOTE — Telephone Encounter (Signed)
Scheduled appt per 8/14 sch message - unable to reach pt . Left message with appt date and time and mailed reminder letter with appt date and time

## 2018-11-10 ENCOUNTER — Telehealth: Payer: Self-pay

## 2018-11-10 NOTE — Telephone Encounter (Signed)
RN spoke with patient regarding upcoming appointment on 8/31.  Pt calling to ensure labs from other physicians were available.    RN placed call to Dr. Radene Ou office, lab worked will be faxed over to office.   Pt notified, no further needs.

## 2018-11-13 NOTE — Assessment & Plan Note (Signed)
Thrombocytosis: Review of the labs revealed platelet counts 469 on 06/07/2016. Patient has had mild elevation of platelets atleast for the last 4 years (538 in 2014; 571 in 2016 and 490 six months ago)  Probable cause: Splenectomy 20 years ago I do not suspect any myeloproliferative neoplasm.  Severe fatigue, hypertension and headaches: Due to low blood pressure  Return to clinic in 1 year for follow-up

## 2018-11-15 ENCOUNTER — Ambulatory Visit (INDEPENDENT_AMBULATORY_CARE_PROVIDER_SITE_OTHER): Payer: 59 | Admitting: Neurology

## 2018-11-15 ENCOUNTER — Encounter: Payer: Self-pay | Admitting: Neurology

## 2018-11-15 ENCOUNTER — Other Ambulatory Visit: Payer: Self-pay

## 2018-11-15 VITALS — BP 97/64 | HR 64 | Ht 63.0 in | Wt 141.0 lb

## 2018-11-15 DIAGNOSIS — R519 Headache, unspecified: Secondary | ICD-10-CM

## 2018-11-15 DIAGNOSIS — R200 Anesthesia of skin: Secondary | ICD-10-CM

## 2018-11-15 DIAGNOSIS — R51 Headache: Secondary | ICD-10-CM | POA: Diagnosis not present

## 2018-11-15 DIAGNOSIS — M542 Cervicalgia: Secondary | ICD-10-CM | POA: Diagnosis not present

## 2018-11-15 DIAGNOSIS — G8929 Other chronic pain: Secondary | ICD-10-CM

## 2018-11-15 NOTE — Patient Instructions (Signed)
Your neurological exam is normal, you do have a mild hand tremor which is not a new finding.  For your neck pain, we will do a cervical spine (i.e. neck) MRI to look for degenerative changes and for comparison with your previous neck MRI.  Your headaches are thankfully a little better. You can continue to use Excedrin as needed. Please remember, common headache triggers are: sleep deprivation, dehydration, overheating, stress, hypoglycemia or skipping meals and blood sugar fluctuations, excessive pain medications or excessive alcohol use or caffeine withdrawal. Some people have food triggers such as aged cheese, orange juice or chocolate, especially dark chocolate, or MSG (monosodium glutamate). Try to avoid these headache triggers as much possible. It may be helpful to keep a headache diary to figure out what makes your headaches worse or brings them on and what alleviates them. Some people report headache onset after exercise but studies have shown that regular exercise may actually prevent headaches from coming. If you have exercise-induced headaches, please make sure that you drink plenty of fluid before and after exercising and that you do not over do it and do not overheat.  We will investigate your hand numbness with an EMG and nerve conduction velocity test, which is an electrical nerve and muscle test, which we will schedule. We will call you with the results.  You have recently seen a cardiologist and had work-up which was thankfully benign.  You are supposed to see a hematologist for your platelets and your elevated white cell count.  I will not order any new blood work from my end of things. Your exam does not suggest any widespread neuropathy or nerve damage, you may have carpal tunnel type symptoms.  We will reconvene in 3 months, sooner if needed.

## 2018-11-15 NOTE — Progress Notes (Signed)
Subjective:    Deborah [name] ID: Deborah Deborah [name] is a 53 y.o. female.  HPI     Star Age, MD, PhD [facility] [address] [place], [place] [address]  Dear Dr. Radene Ou,   I saw your Deborah [name], Deborah Deborah [name], upon your kind request to my neurologic clinic today for initial consultation of her recurrent headaches and numbness in her hands.  Deborah Deborah [name] is unaccompanied today.  As you know, Deborah Deborah [name] is a 53 year old right-handed woman with an underlying medical seasonal allergies, history of pancreatitis, irritable bowel syndrome, B12 deficiency, thrombocytosis, reflux disease, vitamin D deficiency, depression, and anxiety, who reports a history of severe headaches in Deborah past, more recently, she had some recurrence of headaches.  She is more worried about her hand numbness which has been going on for Deborah past 3 months.  It is an intermittent numbness in both hands, typically starting at Deborah wrist, right more than left.  Symptoms are worse at night, and it helps to shake her hands when she wakes up with numbness, no severe pain in no significant tingling noted, some symptoms at times in her feet but not consistently.  She has had no recent changes in her weight.  She does not have any telltale radiating pain.  She has had neck pain for Deborah past 3+ months.  She has tried to use a different pillow which has not really made a big difference.  She feels that her headaches have recently improved.  She does not have any significant snoring and denies any significant daytime somnolence.  Her Epworth sleepiness score is 4 out of 24, fatigue severity score is 41 out of 63.  She lives with her partner and partner's mother.  She is familiar with Deborah diagnosis of obstructive sleep apnea as her significant other has sleep apnea and uses a CPAP machine.  She has had no recent increase in repetitive movements.  She does type at Deborah computer.  She works as a  Licensed conveyancer.  She does not have any recent increase in stress or anxiety.  She does have a history of anxiety and depression and is currently not on any medication.  She tried Wellbutrin in Deborah past and developed a rash.  She has occasional morning headaches.  Her headaches actually have improved since she has been hydrating a little better.  She saw cardiology recently and had work-up in Deborah form of stress testing and echocardiogram.  She had blood work in July in your office which showed a elevated B12, I reviewed Deborah office note from 10/17/2018.  She has had morning headaches.  She had blood work through your office on 10/17/2018 and I reviewed Deborah results: CBC with differential showed WBC of 12.2, RBC of 4.1, platelets of 495 which is mildly elevated, she also had a vitamin D level check but that result was not visible to me. She has previously seen Dr. Orie Rout for headache management.  She had a brain MRI with and without contrast on 07/20/2010 and I reviewed Deborah results: IMPRESSION: Normal study of Deborah brain.   Chronic sinusitis.   Her Past Medical History Is Significant For: Past Medical History:  Diagnosis Date  . Abdominal pain, generalized 11/28/2009   Qualifier: Diagnosis of  By: Trellis Paganini PA-c, Amy S   . Abdominal pain, left upper quadrant 05/29/2008   Qualifier: Diagnosis of  By: Nelson-Smith CMA (AAMA), Dottie    . ABDOMINAL PAIN-EPIGASTRIC 11/28/2009   Qualifier: Diagnosis of  By: Shane Crutch, Amy S   . Abnormal weight gain 11/28/2009   Qualifier: Diagnosis of  By: Trellis Paganini PA-c, Amy S   . B12 DEFICIENCY 12/20/2008   Qualifier: Diagnosis of  By: Marland Mcalpine    . Chronic constipation   . Chronic pancreatitis ([place]) LOW-GRADE   FOLLOWED BY DR Sydell Axon BRODIE  . CONSTIPATION 11/28/2009   Qualifier: Diagnosis of  By: Trellis Paganini PA-c, Amy S   . GENITAL HERPES 05/11/2007   Qualifier: Diagnosis of  By: Mickle Asper, Ramond Craver    . Genital herpes in women   . Hot flashes   . IBS  (irritable bowel syndrome)   . Irritable bowel syndrome 05/11/2007   Qualifier: Diagnosis of  By: Arsenio Loader RN, Ramond Craver    . Menorrhagia   . Nausea alone 11/28/2009   Qualifier: Diagnosis of  By: Trellis Paganini PA-c, Amy S   . PANCREATITIS 05/11/2007   Qualifier: Diagnosis of  By: Arsenio Loader RN, Ramond Craver    . Perimenopausal   . SCOLIOSIS 05/23/2008   Qualifier: Diagnosis of  By: Harlon Ditty CMA (AAMA), Dottie    . Seasonal allergies   . Thrombocytosis ([place]) 07/13/2016    Her Past Surgical History Is Significant For: Past Surgical History:  Procedure Laterality Date  . ABLATION  2014  . APPENDECTOMY  5/21/197   Incidental appy  . KNEE ARTHROSCOPY  2004   RIGHT  . SPLENECTOMY, TOTAL  08/10/1995   For splwnic cyst - accessory spleen also removed    Her Family History Is Significant For: Family History  Problem Relation Age of Onset  . Ulcerative colitis Mother   . Cancer Father        prostate cancer  . CAD Father   . Diverticulitis Father   . Cancer Paternal Grandmother        Ovarian cancer    Her Social History Is Significant For: Social History   Socioeconomic History  . Marital status: Single    Spouse name: Not on file  . Number of children: Not on file  . Years of education: Not on file  . Highest education level: Not on file  Occupational History  . Occupation: Licensed conveyancer  Social Needs  . Financial resource strain: Not on file  . Food insecurity    Worry: Not on file    Inability: Not on file  . Transportation needs    Medical: Not on file    Non-medical: Not on file  Tobacco Use  . Smoking status: Never Smoker  . Smokeless tobacco: Never Used  Substance and Sexual Activity  . Alcohol use: No  . Drug use: No  . Sexual activity: Not on file  Lifestyle  . Physical activity    Days per week: Not on file    Minutes per session: Not on file  . Stress: Not on file  Relationships  . Social Herbalist on phone: Not on file    Gets together: Not on file     Attends religious service: Not on file    Active member of club or organization: Not on file    Attends meetings of clubs or organizations: Not on file    Relationship status: Not on file  Other Topics Concern  . Not on file  Social History Narrative  . Not on file    Her Allergies Are:  Allergies  Allergen Reactions  . Ivp Dye [Iodinated Diagnostic Agents] Swelling    Eyes swell  . Ortho Tri-Cyclen [Norgestimate-Eth Estradiol]  Hives  . Penicillins Hives  :   Her Current Medications Are:  Outpatient Encounter Medications as of 11/15/2018  Medication Sig  . Cholecalciferol (VITAMIN D3) 1000 units CAPS Take 3,000 tablets by mouth daily.   Mariane Baumgarten Calcium (STOOL SOFTENER PO) Take 1 tablet by mouth daily.   . fluticasone (FLONASE) 50 MCG/ACT nasal spray Place 2 sprays into Deborah nose daily.  Marland Kitchen levocetirizine (XYZAL) 5 MG tablet Take 5 mg by mouth every evening.  . montelukast (SINGULAIR) 10 MG tablet Take 10 mg by mouth at bedtime.  . Probiotic Product (ALIGN PO) Take by mouth daily.  . valACYclovir (VALTREX) 500 MG tablet Take 500 mg by mouth daily.  . vitamin B-12 (CYANOCOBALAMIN) 1000 MCG tablet Take 1,000 mcg by mouth 3 (three) times a week.   . zolpidem (AMBIEN) 5 MG tablet TK 1 T PO NIGHTLY  . atorvastatin (LIPITOR) 10 MG tablet Take 1 tablet (10 mg total) by mouth daily. (Deborah [name] not taking: Reported on 11/15/2018)  . [DISCONTINUED] Cyanocobalamin (B-12 COMPLIANCE INJECTION IJ) Inject as directed.   No facility-administered encounter medications on file as of 11/15/2018.   :   Review of Systems:  Out of a complete 14 point review of systems, all are reviewed and negative with Deborah exception of these symptoms as listed below:  Review of Systems  Neurological:       Pt presents today to discuss her worsening numbness in her hands. Pt has noticed this over Deborah past 6 months. Pt is also complaining of headaches. She has only tried excedrin for her headaches. Pt has never had a  sleep study but does endorse snoring.  Epworth Sleepiness Scale 0= would never doze 1= slight chance of dozing 2= moderate chance of dozing 3= high chance of dozing  Sitting and reading: 1 Watching TV: 0 Sitting inactive in a public place (ex. Theater or meeting): 0 As a passenger in a car for an hour without a break: 2 Lying down to rest in Deborah afternoon: 1 Sitting and talking to someone: 0 Sitting quietly after lunch (no alcohol): 0 In a car, while stopped in traffic: 0 Total: 4     Objective:  Neurological Exam  Physical Exam Physical Examination:   Vitals:   11/15/18 1015  BP: 97/64  Pulse: 64    General Examination: Deborah Deborah [name] is a very pleasant 53 y.o. female in no acute distress. She appears well-developed and well-nourished and well groomed.   HEENT: Normocephalic, atraumatic, pupils are equal, round and reactive to light and accommodation. Funduscopic exam is normal with sharp disc margins noted. Corrective eyeglasses in place. Extraocular tracking is good without limitation to gaze excursion or nystagmus noted. Normal smooth pursuit is noted. Hearing is grossly intact. Face is symmetric with normal facial animation and normal facial sensation. Speech is clear with no dysarthria noted. There is no hypophonia. There is no lip, neck/head, jaw or voice tremor. Neck is supple with full range of passive and active motion. There are no carotid bruits on auscultation. Oropharynx exam reveals: mild mouth dryness, adequate dental hygiene and mild airway crowding, due to smaller airway entry. Tongue protrudes centrally and palate elevates symmetrically.   Chest: Clear to auscultation without wheezing, rhonchi or crackles noted.  Heart: S1+S2+0, regular and normal without murmurs, rubs or gallops noted.   Abdomen: Soft, non-tender and non-distended with normal bowel sounds appreciated on auscultation.  Extremities: There is no pitting edema in Deborah distal lower extremities  bilaterally.  Skin: Warm and  dry without trophic changes noted.  Musculoskeletal: exam reveals no obvious joint deformities, tenderness or joint swelling or erythema.   Neurologically:  Mental status: Deborah Deborah [name] is awake, alert and oriented in all 4 spheres. Her immediate and remote memory, attention, language skills and fund of knowledge are appropriate. There is no evidence of aphasia, agnosia, apraxia or anomia. Speech is clear with normal prosody and enunciation. Thought process is linear. Mood is normal and affect is normal.  Cranial nerves II - XII are as described above under HEENT exam. In addition: shoulder shrug is normal with equal shoulder height noted. Motor exam: Normal bulk, strength and tone is noted. There is no drift, resting tremor or rebound. She has a mild bilateral upper extremity postural and action tremor, Romberg is negative. Reflexes are 2+ throughout. Babinski: Toes are flexor bilaterally. Fine motor skills and coordination: intact with normal finger taps, normal hand movements, normal rapid alternating patting, normal foot taps and normal foot agility.  Neg. Tinel's and phalen's bilaterally. Cerebellar testing: No dysmetria or intention tremor on finger to nose testing. Heel to shin is unremarkable bilaterally. There is no truncal or gait ataxia.  Sensory exam: intact to light touch, pinprick, vibration, temperature sense in Deborah upper and lower extremities.  Gait, station and balance: She stands easily. No veering to one side is noted. No leaning to one side is noted. Posture is age-appropriate and stance is narrow based. Gait shows normal stride length and normal pace. No problems turning are noted. Tandem walk is unremarkable.   Assessment and Plan:    In summary, Deborah Deborah [name] is a very pleasant 52 y.o.-year old female with an underlying medical seasonal allergies, history of pancreatitis, irritable bowel syndrome, B12 deficiency, thrombocytosis, reflux  disease, vitamin D deficiency, depression, and anxiety, who presents for evaluation of her Hand numbness of approximately 3 months duration.  She has a remote history of recurrent headaches and had some resurgence of intermittent headaches but these have actually improved lately.  She has been trying to hydrate well.  She had some cardiac work-up recently.On neurological examination, she has no obvious focal findings.  She has a mild bilateral upper extremity postural and action tremor.  She has a family history of essential tremor.  Her symptoms could be in keeping with carpal tunnel, right more than left.  She does also report a longstanding history of neck pain of at least 3 months duration.  She has not had a neck MRI.  She had brain MRI in Deborah distant past.  I suggested we proceed with a cervical spine MRI without contrast and EMG and nerve conduction testing of her upper extremities.  Her examination does not suggest any widespread neuropathy. She is in Deborah process of seeing a hematologist for her elevated platelet count and white cell count.  She had some blood work recently and has had normal TSH and high normal B12.  She has reduced her B12 supplementation to 3 times a week.  She takes a oral supplement. Reminded things, I did not suggest any new blood work today.  We will keep her posted as to her neck MRI results and EMG results.  I plan to follow-up with her in about 3 months, sooner if needed.  She can continue to take Excedrin as needed for her intermittent headaches.  We talked about headache triggers also today.  I answered all her questions today and she was in agreement with Deborah plan. Thank you very much for allowing me  to participate in Deborah care of this nice Deborah [name]. If I can be of any further assistance to you please do not hesitate to call me at [phone].  Sincerely,   Star Age, MD, PhD

## 2018-11-16 ENCOUNTER — Telehealth: Payer: Self-pay | Admitting: Neurology

## 2018-11-16 NOTE — Telephone Encounter (Signed)
no to the covid-19 questions MR Cervical spine wo contrast Dr. Rexene Alberts [facility] Auth: [place] via [facility] website. Patient is scheduled at [facility] for 11/22/18.

## 2018-11-19 NOTE — Progress Notes (Signed)
Patient Care Team: Rankins, Bill Salinas, MD as PCP - General (Family Medicine)  DIAGNOSIS:    ICD-10-CM   1. Thrombocytosis (HCC)  D47.3     CHIEF COMPLIANT: Follow-up of thrombocytosis  INTERVAL HISTORY: Deborah [name] is a 53 y.o. with above-mentioned history of thrombocytosis. I last saw her 2.5 years ago. Labs on 11/01/18 showed: Hg 13.6, HCT 40.3, MCV 102, platelets 494,000, WBC 11.7. She presents to the clinic today for follow-up.   REVIEW OF SYSTEMS:   Constitutional: Denies fevers, chills or abnormal weight loss Eyes: Denies blurriness of vision Ears, nose, mouth, throat, and face: Denies mucositis or sore throat Respiratory: Denies cough, dyspnea or wheezes Cardiovascular: Denies palpitation, chest discomfort Gastrointestinal: Denies nausea, heartburn or change in bowel habits Skin: Denies abnormal skin rashes Lymphatics: Denies new lymphadenopathy or easy bruising Neurological: Denies numbness, tingling or new weaknesses Behavioral/Psych: Mood is stable, no new changes  Extremities: No lower extremity edema Breast: denies any pain or lumps or nodules in either breasts All other systems were reviewed with the patient and are negative.  I have reviewed the past medical history, past surgical history, social history and family history with the patient and they are unchanged from previous note.  ALLERGIES:  is allergic to ivp dye [iodinated diagnostic agents]; ortho tri-cyclen [norgestimate-eth estradiol]; and penicillins.  MEDICATIONS:  Current Outpatient Medications  Medication Sig Dispense Refill  . atorvastatin (LIPITOR) 10 MG tablet Take 1 tablet (10 mg total) by mouth daily. (Patient not taking: Reported on 11/15/2018) 90 tablet 1  . Cholecalciferol (VITAMIN D3) 1000 units CAPS Take 3,000 tablets by mouth daily.     Mariane Baumgarten Calcium (STOOL SOFTENER PO) Take 1 tablet by mouth daily.     . fluticasone (FLONASE) 50 MCG/ACT nasal spray Place 2 sprays into the  nose daily.    Marland Kitchen levocetirizine (XYZAL) 5 MG tablet Take 5 mg by mouth every evening.    . montelukast (SINGULAIR) 10 MG tablet Take 10 mg by mouth at bedtime.    . Probiotic Product (ALIGN PO) Take by mouth daily.    . valACYclovir (VALTREX) 500 MG tablet Take 500 mg by mouth daily.    . vitamin B-12 (CYANOCOBALAMIN) 1000 MCG tablet Take 1,000 mcg by mouth 3 (three) times a week.     . zolpidem (AMBIEN) 5 MG tablet TK 1 T PO NIGHTLY     No current facility-administered medications for this visit.     PHYSICAL EXAMINATION: ECOG PERFORMANCE STATUS: 1 - Symptomatic but completely ambulatory  Vitals:   11/20/18 1144  BP: 106/69  Pulse: 63  Resp: 18  Temp: 98.2 F (36.8 C)  SpO2: 97%   Filed Weights   11/20/18 1144  Weight: 141 lb 8 oz (64.2 kg)    GENERAL: alert, no distress and comfortable SKIN: skin color, texture, turgor are normal, no rashes or significant lesions EYES: normal, Conjunctiva are pink and non-injected, sclera clear OROPHARYNX: no exudate, no erythema and lips, buccal mucosa, and tongue normal  NECK: supple, thyroid normal size, non-tender, without nodularity LYMPH: no palpable lymphadenopathy in the cervical, axillary or inguinal LUNGS: clear to auscultation and percussion with normal breathing effort HEART: regular rate & rhythm and no murmurs and no lower extremity edema ABDOMEN: abdomen soft, non-tender and normal bowel sounds MUSCULOSKELETAL: no cyanosis of digits and no clubbing  NEURO: alert & oriented x 3 with fluent speech, no focal motor/sensory deficits EXTREMITIES: No lower extremity edema  LABORATORY DATA:  I have reviewed the  data as listed CMP Latest Ref Rng & Units 11/01/2018 12/23/2015 07/30/2013  Glucose 65 - 99 mg/dL 88 92 104(H)  BUN 6 - 24 mg/dL 14 14 11   Creatinine 0.57 - 1.00 mg/dL 0.77 0.83 0.63  Sodium 134 - 144 mmol/L 137 139 138  Potassium 3.5 - 5.2 mmol/L 4.2 4.0 3.7  Chloride 96 - 106 mmol/L 100 108 100  CO2 20 - 29 mmol/L 23  23 26   Calcium 8.7 - 10.2 mg/dL 9.6 9.1 9.3  Total Protein 6.0 - 8.5 g/dL 6.6 6.9 8.2  Total Bilirubin 0.0 - 1.2 mg/dL 0.4 0.8 0.3  Alkaline Phos 39 - 117 IU/L 90 67 86  AST 0 - 40 IU/L 24 24 21   ALT 0 - 32 IU/L 23 14 17     Lab Results  Component Value Date   WBC 11.7 (H) 11/01/2018   HGB 13.6 11/01/2018   HCT 40.3 11/01/2018   MCV 102 (H) 11/01/2018   PLT 494 (H) 11/01/2018   NEUTROABS 6.1 12/23/2015    ASSESSMENT & PLAN:  Thrombocytosis (HCC) Thrombocytosis: Review of the labs revealed platelet counts 469 on 06/07/2016. Patient has had mild elevation of platelets atleast for the last 4 years (538 in 2014; 571 in 2016 and 490 six months ago) Platelet count 11/01/2018: 494 Platelet count has not changed much in the last 7 years.  Probable cause: Splenectomy 20 years ago I do not suspect any myeloproliferative neoplasm.  Severe fatigue, hypertension and headaches: Due to low blood pressure Recent onset of chest pains, extremity pain: Cardiac work-up negative.  I do not suspect the platelet count to be the cause of her symptoms.  However given her other cardiac comorbidities including blood sugars, cholesterol etc. I recommended starting her on aspirin 81 mg daily.  I counseled her about the risks and benefits of aspirin and she is agreeable.  Return to clinic in 1 year for follow-up    No orders of the defined types were placed in this encounter.  The patient has a good understanding of the overall plan. she agrees with it. she will call with any problems that may develop before the next visit here.  Nicholas Lose, MD 11/20/2018  Julious Oka Dorshimer am acting as scribe for Dr. Nicholas Lose.  I have reviewed the above documentation for accuracy and completeness, and I agree with the above.

## 2018-11-20 ENCOUNTER — Other Ambulatory Visit: Payer: Self-pay

## 2018-11-20 ENCOUNTER — Inpatient Hospital Stay: Payer: 59 | Attending: Hematology and Oncology | Admitting: Hematology and Oncology

## 2018-11-20 DIAGNOSIS — I10 Essential (primary) hypertension: Secondary | ICD-10-CM | POA: Diagnosis not present

## 2018-11-20 DIAGNOSIS — D473 Essential (hemorrhagic) thrombocythemia: Secondary | ICD-10-CM

## 2018-11-20 DIAGNOSIS — R51 Headache: Secondary | ICD-10-CM | POA: Diagnosis not present

## 2018-11-20 DIAGNOSIS — R5383 Other fatigue: Secondary | ICD-10-CM | POA: Insufficient documentation

## 2018-11-20 DIAGNOSIS — D75839 Thrombocytosis, unspecified: Secondary | ICD-10-CM

## 2018-11-20 MED ORDER — ASPIRIN EC 81 MG PO TBEC: 81.0000 mg | DELAYED_RELEASE_TABLET | Freq: Every day | ORAL | Status: AC

## 2018-11-21 ENCOUNTER — Telehealth: Payer: Self-pay | Admitting: Hematology and Oncology

## 2018-11-21 NOTE — Telephone Encounter (Signed)
I left a message regarding schedule  

## 2018-11-22 ENCOUNTER — Other Ambulatory Visit: Payer: Self-pay

## 2018-11-22 ENCOUNTER — Ambulatory Visit: Payer: 59

## 2018-11-22 DIAGNOSIS — G8929 Other chronic pain: Secondary | ICD-10-CM

## 2018-11-22 DIAGNOSIS — R51 Headache: Secondary | ICD-10-CM | POA: Diagnosis not present

## 2018-11-22 DIAGNOSIS — R519 Headache, unspecified: Secondary | ICD-10-CM

## 2018-11-22 DIAGNOSIS — R200 Anesthesia of skin: Secondary | ICD-10-CM

## 2018-11-22 DIAGNOSIS — M542 Cervicalgia: Secondary | ICD-10-CM | POA: Diagnosis not present

## 2018-11-23 NOTE — Progress Notes (Signed)
Please advise patient that her recent neck MRI without contrast shows some changes at level C4-5 with mild spinal canal stenosis which is a tightening of the spinal canal, no cord compression thankfully, no herniated disc but left-sided nerve root compression which can cause radiating neck pain on the left.  She is scheduled for EMG testing next month, if possible I would favor waiting out those results as well before we decide how to proceed next. Ultimately, she may benefit from seeing a spine specialist, to consider injection treatment for example. Michel Bickers

## 2018-11-29 ENCOUNTER — Telehealth: Payer: Self-pay

## 2018-11-29 NOTE — Telephone Encounter (Signed)
-----   Message from Anda Latina, RN sent at 11/28/2018  2:01 PM EDT -----  ----- Message ----- From: Star Age, MD Sent: 11/23/2018   5:40 PM EDT To: Lester [place], RN  Please advise patient that her recent neck MRI without contrast shows some changes at level C4-5 with mild spinal canal stenosis which is a tightening of the spinal canal, no cord compression thankfully, no herniated disc but left-sided nerve root compression which can cause radiating neck pain on the left.  She is scheduled for EMG testing next month, if possible I would favor waiting out those results as well before we decide how to proceed next. Ultimately, she may benefit from seeing a spine specialist, to consider injection treatment for example. Michel Bickers

## 2018-11-29 NOTE — Telephone Encounter (Signed)
I reached out to the pt and advised of results pt verbalized understanding and had no further questions at this time.  Pt is scheduled for EMG on 01/01/2019 with Dr. Jannifer Franklin. I have added pt to wait list to see if an earlier appointment becomes available.

## 2018-12-20 ENCOUNTER — Telehealth: Payer: Self-pay

## 2018-12-20 NOTE — Telephone Encounter (Signed)
Pt is on schedule for an earlier EMG appt, I have left a vm asking her to call back and see if she would like to take on of the 12/25/2018 openings.

## 2019-01-01 ENCOUNTER — Telehealth: Payer: Self-pay

## 2019-01-01 ENCOUNTER — Ambulatory Visit: Payer: 59 | Admitting: Neurology

## 2019-01-01 ENCOUNTER — Other Ambulatory Visit: Payer: Self-pay

## 2019-01-01 ENCOUNTER — Encounter: Payer: Self-pay | Admitting: Neurology

## 2019-01-01 ENCOUNTER — Ambulatory Visit (INDEPENDENT_AMBULATORY_CARE_PROVIDER_SITE_OTHER): Payer: 59 | Admitting: Neurology

## 2019-01-01 DIAGNOSIS — R519 Headache, unspecified: Secondary | ICD-10-CM

## 2019-01-01 DIAGNOSIS — E119 Type 2 diabetes mellitus without complications: Secondary | ICD-10-CM

## 2019-01-01 DIAGNOSIS — R200 Anesthesia of skin: Secondary | ICD-10-CM

## 2019-01-01 DIAGNOSIS — M542 Cervicalgia: Secondary | ICD-10-CM | POA: Diagnosis not present

## 2019-01-01 NOTE — Procedures (Signed)
     HISTORY:  Isel Truglio is a 53 year old patient with a history of neck pain and some right upper extremity numbness and tingling that has been present since March 2020.  The patient also has significant right knee discomfort, she has difficulty walking with the right leg.  She has been evaluated for these issues.  NERVE CONDUCTION STUDIES:  Nerve conduction studies were performed on both upper extremities. The distal motor latencies and motor amplitudes for the median and ulnar nerves were within normal limits. The nerve conduction velocities for these nerves were also normal. The sensory latencies for the median and ulnar nerves were normal. The F wave latencies for the ulnar nerves were within normal limits.  Nerve conduction studies were performed on the right lower extremity. The distal motor latencies and motor amplitudes for the peroneal and posterior tibial nerves were within normal limits. The nerve conduction velocities for these nerves were also normal. The sensory latencies for the peroneal and sural nerves were within normal limits. The F wave latency for the posterior tibial nerve was within normal limits.   EMG STUDIES:  EMG study was performed on the right upper extremity:  The first dorsal interosseous muscle reveals 2 to 4 K units with full recruitment. No fibrillations or positive waves were noted. The abductor pollicis brevis muscle reveals 2 to 4 K units with full recruitment. No fibrillations or positive waves were noted. The extensor indicis proprius muscle reveals 1 to 3 K units with full recruitment. No fibrillations or positive waves were noted. The pronator teres muscle reveals 2 to 3 K units with full recruitment. No fibrillations or positive waves were noted. The biceps muscle reveals 1 to 2 K units with full recruitment. No fibrillations or positive waves were noted. The triceps muscle reveals 2 to 4 K units with full recruitment. No fibrillations or  positive waves were noted. The anterior deltoid muscle reveals 2 to 3 K units with full recruitment. No fibrillations or positive waves were noted. The cervical paraspinal muscles were tested at 2 levels. No abnormalities of insertional activity were seen at either level tested. There was poor relaxation.   IMPRESSION:  Nerve conduction studies done on both upper extremities and on the right lower extremity were unremarkable, no evidence of a neuropathy is seen.  EMG of the right upper extremity is unremarkable, no evidence of a cervical radiculopathy was seen.  Jill Alexanders MD 01/01/2019 9:43 AM  [facility] [address] [place], [place] [address]  Phone [phone] Fax [phone]

## 2019-01-01 NOTE — Progress Notes (Addendum)
Please call and advise the patient that the recent EMG and nerve conduction velocity test, which is the electrical nerve and muscle test we we performed, was reported as within normal limits. We checked for abnormal electrical discharges in the muscles or nerves and the report suggested normal findings. No explantation on the c spine MRI or emg for her RUE numbness and tingling. She is advised to FU with PCP and may benefit from seeing a spine specialist next. She can keep appt in Nov, if she wishes or I would be happy to see her prn if she prefers.     [place]    Nerve / Sites Muscle Latency Ref. Amplitude Ref. Rel Amp Segments Distance Velocity Ref. Area    ms ms mV mV %  cm m/s m/s mVms  L Median - APB     Wrist APB 3.1 ?4.4 7.1 ?4.0 100 Wrist - APB 7   24.0     Upper arm APB 6.9  7.9  111 Upper arm - Wrist 20 53 ?49 28.4  R Median - APB     Wrist APB 2.9 ?4.4 6.1 ?4.0 100 Wrist - APB 7   25.9     Upper arm APB 6.7  6.3  104 Upper arm - Wrist 20 53 ?49 27.4  L Ulnar - ADM     Wrist ADM 2.7 ?3.3 10.5 ?6.0 100 Wrist - ADM 7   32.6     B.Elbow ADM 5.5  8.1  77.7 B.Elbow - Wrist 18 64 ?49 27.2     A.Elbow ADM 7.1  7.9  97.1 A.Elbow - B.Elbow 10 64 ?49 26.6         A.Elbow - Wrist      R Ulnar - ADM     Wrist ADM 2.6 ?3.3 9.5 ?6.0 100 Wrist - ADM 7   33.9     B.Elbow ADM 5.8  7.9  83.3 B.Elbow - Wrist 19 60 ?49 29.2     A.Elbow ADM 7.6  7.7  97.4 A.Elbow - B.Elbow 10 55 ?49 28.0         A.Elbow - Wrist      R Peroneal - EDB     Ankle EDB 4.5 ?6.5 4.9 ?2.0 100 Ankle - EDB 9   19.3     Fib head EDB 9.6  4.6  94.6 Fib head - Ankle 25 49 ?44 18.5     Pop fossa EDB 11.9  4.5  96.6 Pop fossa - Fib head 10 45 ?44 18.3         Pop fossa - Ankle      R Tibial - AH     Ankle AH 3.3 ?5.8 15.3 ?4.0 100 Ankle - AH 9   29.5     Pop fossa AH 12.2  12.1  78.8 Pop fossa - Ankle 40 45 ?41 26.1                  SNC    Nerve / Sites Rec. Site Peak Lat Ref.  Amp Ref. Segments Distance    ms ms V V  cm   R Sural - Ankle (Calf)     Calf Ankle 3.6 ?4.4 12 ?6 Calf - Ankle 14  R Superficial peroneal - Ankle     Lat leg Ankle 4.0 ?4.4 10 ?6 Lat leg - Ankle 14  L Median - Orthodromic (Dig II, Mid palm)     Dig II Wrist 3.0 ?3.4 21 ?10  Dig II - Wrist 13  R Median - Orthodromic (Dig II, Mid palm)     Dig II Wrist 3.0 ?3.4 18 ?10 Dig II - Wrist 13  L Ulnar - Orthodromic, (Dig V, Mid palm)     Dig V Wrist 2.6 ?3.1 11 ?5 Dig V - Wrist 11  R Ulnar - Orthodromic, (Dig V, Mid palm)     Dig V Wrist 2.7 ?3.1 12 ?5 Dig V - Wrist 26                 F  Wave    Nerve F Lat Ref.   ms ms  L Ulnar - ADM 26.2 ?32.0  R Ulnar - ADM 26.6 ?32.0  R Tibial - AH 46.5 ?56.0

## 2019-01-01 NOTE — Telephone Encounter (Signed)
I called pt and discussed her NCV/EMG results and recommendations. Pt will discuss a referral to a spine specialist with her PCP. Pt verbalized understanding of results. Pt had no questions at this time but was encouraged to call back if questions arise.

## 2019-01-01 NOTE — Telephone Encounter (Signed)
-----   Message from Star Age, MD sent at 01/01/2019  9:54 AM EDT ----- Please call and advise the patient that the recent EMG and nerve conduction velocity test, which is the electrical nerve and muscle test we we performed, was reported as within normal limits. We checked for abnormal electrical discharges in the muscles or nerves and the report suggested normal findings. No explantation on the c spine MRI or emg for her RUE numbness and tingling. She is advised to FU with PCP and may benefit from seeing a spine specialist next. She can keep appt in Nov, if she wishes or I would be happy to see her prn if she prefers.

## 2019-01-01 NOTE — Telephone Encounter (Signed)
I called pt to discuss her EMG/NCV results. No answer, left a message asking her to call me back.

## 2019-01-01 NOTE — Telephone Encounter (Signed)
Pt returned call. Please call back when available. 

## 2019-01-01 NOTE — Progress Notes (Signed)
Please refer to EMG and nerve conduction procedure note.  

## 2019-02-19 ENCOUNTER — Ambulatory Visit: Payer: 59 | Admitting: Neurology

## 2019-03-07 ENCOUNTER — Ambulatory Visit (INDEPENDENT_AMBULATORY_CARE_PROVIDER_SITE_OTHER): Payer: 59 | Admitting: Cardiology

## 2019-03-07 ENCOUNTER — Other Ambulatory Visit: Payer: Self-pay

## 2019-03-07 ENCOUNTER — Encounter: Payer: Self-pay | Admitting: Cardiology

## 2019-03-07 VITALS — BP 104/68 | HR 78 | Ht 63.0 in | Wt 143.0 lb

## 2019-03-07 DIAGNOSIS — R0789 Other chest pain: Secondary | ICD-10-CM | POA: Diagnosis not present

## 2019-03-07 DIAGNOSIS — E782 Mixed hyperlipidemia: Secondary | ICD-10-CM

## 2019-03-07 DIAGNOSIS — E559 Vitamin D deficiency, unspecified: Secondary | ICD-10-CM

## 2019-03-07 DIAGNOSIS — E119 Type 2 diabetes mellitus without complications: Secondary | ICD-10-CM | POA: Diagnosis not present

## 2019-03-07 DIAGNOSIS — E538 Deficiency of other specified B group vitamins: Secondary | ICD-10-CM

## 2019-03-07 DIAGNOSIS — D473 Essential (hemorrhagic) thrombocythemia: Secondary | ICD-10-CM | POA: Diagnosis not present

## 2019-03-07 DIAGNOSIS — D75839 Thrombocytosis, unspecified: Secondary | ICD-10-CM

## 2019-03-07 HISTORY — DX: Vitamin D deficiency, unspecified: E55.9

## 2019-03-07 HISTORY — DX: Deficiency of other specified B group vitamins: E53.8

## 2019-03-07 HISTORY — DX: Mixed hyperlipidemia: E78.2

## 2019-03-07 NOTE — Patient Instructions (Signed)
Medication Instructions:  Your physician recommends that you continue on your current medications as directed. Please refer to the Current Medication list given to you today.  *If you need a refill on your cardiac medications before your next appointment, please call your pharmacy*  Lab Work: Your physician recommends that you return for lab work in: Today  If you have labs (blood work) drawn today and your tests are completely normal, you will receive your results only by: Marland Kitchen MyChart Message (if you have MyChart) OR . A paper copy in the mail If you have any lab test that is abnormal or we need to change your treatment, we will call you to review the results.  Testing/Procedures: None Ordered  Follow-Up: At [facility], you and your health needs are our priority.  As part of our continuing mission to provide you with exceptional heart care, we have created designated Provider Care Teams.  These Care Teams include your primary Cardiologist (physician) and Advanced Practice Providers (APPs -  Physician Assistants and Nurse Practitioners) who all work together to provide you with the care you need, when you need it.  Your next appointment:   6 month(s)  The format for your next appointment:   In Person  Provider:   Jyl Heinz, MD

## 2019-03-07 NOTE — Progress Notes (Signed)
Cardiology Office Note:    Date:  03/07/2019   ID:  Deborah [name], DOB 12/07/1965, MRN [id number]  PCP:  Aretta Nip, MD  Cardiologist:  Jenean Lindau, MD   Referring MD: Aretta Nip, MD    ASSESSMENT:    1. Thrombocytosis (HCC)   2. Chest discomfort   3. Mixed dyslipidemia   4. Diet-controlled diabetes mellitus ([place])   5. Vitamin D deficiency   6. Vitamin B12 deficiency    PLAN:    In order of problems listed above:  1. Chest discomfort: The symptoms have resolved.  Results of stress testing discussed with the patient at extensive length and she vocalized understanding 2. Mixed dyslipidemia: She is now on statin therapy.  We will check her lipid profile.  She is dieting well. 3. She has vitamin B-12 vitamin D deficiency and request these blood work to be done and I will oblige and send a copy to her primary care physician.  She also requests hemoglobin A1c as she is diet controlled diabetes.  Importance of diet and regular exercise stressed and she vocalized understanding 4. Patient will be seen in follow-up appointment in 9 months or earlier if the patient has any concerns    Medication Adjustments/Labs and Tests Ordered: Current medicines are reviewed at length with the patient today.  Concerns regarding medicines are outlined above.  No orders of the defined types were placed in this encounter.  No orders of the defined types were placed in this encounter.    No chief complaint on file.    History of Present Illness:    Deborah [name] is a 53 y.o. female.  Patient has past medical history of thrombocytosis.  She was evaluated by me today.  She was evaluated for chest discomfort.  Subsequently she was fine.  Her testing was unremarkable.  She exercises on a regular basis.  She also has elevated lipids and was started on statin therapy.  She tells me that she has diet-controlled diabetes mellitus and requests that I check her vitamin D,  B12 level and hemoglobin A1c.  At the time of my evaluation, the patient is alert awake oriented and in no distress.  Past Medical History:  Diagnosis Date  . Abdominal pain, generalized 11/28/2009   Qualifier: Diagnosis of  By: Trellis Paganini PA-c, Amy S   . Abdominal pain, left upper quadrant 05/29/2008   Qualifier: Diagnosis of  By: Nelson-Smith CMA (AAMA), Dottie    . ABDOMINAL PAIN-EPIGASTRIC 11/28/2009   Qualifier: Diagnosis of  By: Trellis Paganini PA-c, Amy S   . Abnormal weight gain 11/28/2009   Qualifier: Diagnosis of  By: Trellis Paganini PA-c, Amy S   . B12 DEFICIENCY 12/20/2008   Qualifier: Diagnosis of  By: Marland Mcalpine    . Chronic constipation   . Chronic pancreatitis ([place]) LOW-GRADE   FOLLOWED BY DR Sydell Axon BRODIE  . CONSTIPATION 11/28/2009   Qualifier: Diagnosis of  By: Trellis Paganini PA-c, Amy S   . GENITAL HERPES 05/11/2007   Qualifier: Diagnosis of  By: Mickle Asper, Ramond Craver    . Genital herpes in women   . Hot flashes   . IBS (irritable bowel syndrome)   . Irritable bowel syndrome 05/11/2007   Qualifier: Diagnosis of  By: Arsenio Loader RN, Ramond Craver    . Menorrhagia   . Nausea alone 11/28/2009   Qualifier: Diagnosis of  By: Trellis Paganini PA-c, Amy S   . PANCREATITIS 05/11/2007   Qualifier: Diagnosis of  By: Arsenio Loader RN, Ramond Craver    .  Perimenopausal   . SCOLIOSIS 05/23/2008   Qualifier: Diagnosis of  By: Harlon Ditty CMA (AAMA), Dottie    . Seasonal allergies   . Thrombocytosis ([place]) 07/13/2016    Past Surgical History:  Procedure Laterality Date  . ABLATION  2014  . APPENDECTOMY  5/21/197   Incidental appy  . KNEE ARTHROSCOPY  2004   RIGHT  . 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 aspirin EC 81 MG tablet Take 1 tablet (81 mg total) by mouth daily.  Marland Kitchen atorvastatin (LIPITOR) 10 MG tablet Take 1 tablet (10 mg total) by mouth daily.  . Cholecalciferol (VITAMIN D3) 1000 units CAPS Take 3,000 Units by  mouth daily.   Mariane Baumgarten Calcium (STOOL SOFTENER PO) Take 1 tablet by mouth daily.   . fluticasone (FLONASE) 50 MCG/ACT nasal spray Place 2 sprays into the nose daily.  Marland Kitchen levocetirizine (XYZAL) 5 MG tablet Take 5 mg by mouth every evening.  . montelukast (SINGULAIR) 10 MG tablet Take 10 mg by mouth at bedtime.  . Probiotic Product (ALIGN PO) Take by mouth daily.  . valACYclovir (VALTREX) 500 MG tablet Take 500 mg by mouth daily.  Marland Kitchen venlafaxine (EFFEXOR) 37.5 MG tablet Take 37.5 mg by mouth daily.  . vitamin B-12 (CYANOCOBALAMIN) 1000 MCG tablet Take 1,000 mcg by mouth 3 (three) times a week.   . zolpidem (AMBIEN) 5 MG tablet TK 1 T PO NIGHTLY     Allergies:   Ivp dye [iodinated diagnostic agents], Ortho tri-cyclen [norgestimate-eth estradiol], and Penicillins   Social History   Socioeconomic History  . Marital status: Single    Spouse name: Not on file  . Number of children: Not on file  . Years of education: Not on file  . Highest education level: Not on file  Occupational History  . Occupation: Licensed conveyancer  Tobacco Use  . Smoking status: Never Smoker  . Smokeless tobacco: Never Used  Substance and Sexual Activity  . Alcohol use: No  . Drug use: No  . Sexual activity: Not on file  Other Topics Concern  . Not on file  Social History Narrative  . Not on file   Social Determinants of Health   Financial Resource Strain:   . Difficulty of Paying Living Expenses: Not on file  Food Insecurity:   . Worried About Charity fundraiser in the Last Year: Not on file  . Ran Out of Food in the Last Year: Not on file  Transportation Needs:   . Lack of Transportation (Medical): Not on file  . Lack of Transportation (Non-Medical): Not on file  Physical Activity:   . Days of Exercise per Week: Not on file  . Minutes of Exercise per Session: Not on file  Stress:   . Feeling of Stress : Not on file  Social Connections:   . Frequency of Communication with Friends and Family: Not on file   . Frequency of Social Gatherings with Friends and Family: Not on file  . Attends Religious Services: Not on file  . Active Member of Clubs or Organizations: Not on file  . Attends Archivist Meetings: Not on file  . Marital Status: Not on file     Family History: The patient's family history includes CAD in her father; Cancer in her father and paternal grandmother; Diverticulitis in her father; Ulcerative colitis in her mother.  ROS:   Please see  the history of present illness.    All other systems reviewed and are negative.  EKGs/Labs/Other Studies Reviewed:    The following studies were reviewed today: IMPRESSIONS    1. The left ventricle has normal systolic function, with an ejection fraction of 55-60%. The cavity size was normal. Left ventricular diastolic Doppler parameters are consistent with impaired relaxation. No evidence of left ventricular regional wall  motion abnormalities.  2. The right ventricle has normal systolic function. The cavity was normal. There is no increase in right ventricular wall thickness.  3. No evidence of mitral valve stenosis.  4. The aortic valve is tricuspid. No stenosis of the aortic valve.  5. The aorta is normal unless otherwise noted.  6. The aortic root and ascending aorta are normal in size and structure.   Study Highlights    Nuclear stress EF: 59%.  There was no ST segment deviation noted during stress.  This is a low risk study.  The study is normal.  The left ventricular ejection fraction is normal (55-65%).   Normal resting and stress perfusion. No ischemia or infarction EF 59%      Recent Labs: 11/01/2018: ALT 23; BUN 14; Creatinine, Ser 0.77; Hemoglobin 13.6; Platelets 494; Potassium 4.2; Sodium 137; TSH 4.180  Recent Lipid Panel    Component Value Date/Time   CHOL 211 (H) 11/01/2018 0814   TRIG 72 11/01/2018 0814   HDL 62 11/01/2018 0814   CHOLHDL 3.4 11/01/2018 0814   LDLCALC 135 (H) 11/01/2018  0814    Physical Exam:    VS:  BP 104/68 (BP Location: Right Arm, Patient Position: Sitting, Cuff Size: Normal)   Pulse 78   Ht 5\' 3"  (1.6 m)   Wt 143 lb (64.9 kg)   SpO2 96%   BMI 25.33 kg/m     Wt Readings from Last 3 Encounters:  03/07/19 143 lb (64.9 kg)  11/20/18 141 lb 8 oz (64.2 kg)  11/15/18 141 lb (64 kg)     GEN: Patient is in no acute distress HEENT: Normal NECK: No JVD; No carotid bruits LYMPHATICS: No lymphadenopathy CARDIAC: Hear sounds regular, 2/6 systolic murmur at the apex. RESPIRATORY:  Clear to auscultation without rales, wheezing or rhonchi  ABDOMEN: Soft, non-tender, non-distended MUSCULOSKELETAL:  No edema; No deformity  SKIN: Warm and dry NEUROLOGIC:  Alert and oriented x 3 PSYCHIATRIC:  Normal affect   Signed, Jenean Lindau, MD  03/07/2019 9:24 AM    [place]

## 2019-03-08 LAB — CBC
Hematocrit: 40.1 % (ref 34.0–46.6)
Hemoglobin: 14.1 g/dL (ref 11.1–15.9)
MCH: 34.4 pg — ABNORMAL HIGH (ref 26.6–33.0)
MCHC: 35.2 g/dL (ref 31.5–35.7)
MCV: 98 fL — ABNORMAL HIGH (ref 79–97)
Platelets: 446 10*3/uL (ref 150–450)
RBC: 4.1 x10E6/uL (ref 3.77–5.28)
RDW: 12.3 % (ref 11.7–15.4)
WBC: 9.8 10*3/uL (ref 3.4–10.8)

## 2019-03-08 LAB — BASIC METABOLIC PANEL
BUN/Creatinine Ratio: 21 (ref 9–23)
BUN: 15 mg/dL (ref 6–24)
CO2: 22 mmol/L (ref 20–29)
Calcium: 9.7 mg/dL (ref 8.7–10.2)
Chloride: 102 mmol/L (ref 96–106)
Creatinine, Ser: 0.73 mg/dL (ref 0.57–1.00)
GFR calc Af Amer: 110 mL/min/{1.73_m2} (ref >59)
GFR calc non Af Amer: 95 mL/min/{1.73_m2} (ref >59)
Glucose: 92 mg/dL (ref 65–99)
Potassium: 4.4 mmol/L (ref 3.5–5.2)
Sodium: 138 mmol/L (ref 134–144)

## 2019-03-08 LAB — LIPID PANEL
Chol/HDL Ratio: 2.3 ratio (ref 0.0–4.4)
Cholesterol, Total: 169 mg/dL (ref 100–199)
HDL: 72 mg/dL (ref >39)
LDL Chol Calc (NIH): 86 mg/dL (ref 0–99)
Triglycerides: 56 mg/dL (ref 0–149)
VLDL Cholesterol Cal: 11 mg/dL (ref 5–40)

## 2019-03-08 LAB — HEPATIC FUNCTION PANEL
ALT: 23 IU/L (ref 0–32)
AST: 22 IU/L (ref 0–40)
Albumin: 4.3 g/dL (ref 3.8–4.9)
Alkaline Phosphatase: 82 IU/L (ref 39–117)
Bilirubin Total: 0.4 mg/dL (ref 0.0–1.2)
Bilirubin, Direct: 0.13 mg/dL (ref 0.00–0.40)
Total Protein: 6.7 g/dL (ref 6.0–8.5)

## 2019-03-08 LAB — HEMOGLOBIN A1C
Est. average glucose Bld gHb Est-mCnc: 111 mg/dL
Hgb A1c MFr Bld: 5.5 % (ref 4.8–5.6)

## 2019-03-08 LAB — VITAMIN D 25 HYDROXY (VIT D DEFICIENCY, FRACTURES): Vit D, 25-Hydroxy: 51.3 ng/mL (ref 30.0–100.0)

## 2019-03-08 LAB — VITAMIN B12: Vitamin B-12: 960 pg/mL (ref 232–1245)

## 2019-03-09 ENCOUNTER — Other Ambulatory Visit: Payer: Self-pay

## 2019-03-12 ENCOUNTER — Encounter: Payer: Self-pay | Admitting: *Deleted

## 2019-03-12 ENCOUNTER — Other Ambulatory Visit: Payer: Self-pay

## 2019-03-12 ENCOUNTER — Ambulatory Visit: Payer: 59 | Admitting: Family Medicine

## 2019-03-12 ENCOUNTER — Encounter: Payer: Self-pay | Admitting: Family Medicine

## 2019-03-12 VITALS — BP 102/64 | HR 63 | Temp 96.9°F | Ht 63.0 in | Wt 143.5 lb

## 2019-03-12 DIAGNOSIS — R5383 Other fatigue: Secondary | ICD-10-CM

## 2019-03-12 DIAGNOSIS — M4802 Spinal stenosis, cervical region: Secondary | ICD-10-CM | POA: Diagnosis not present

## 2019-03-12 NOTE — Progress Notes (Signed)
Chief Complaint  Patient presents with  . New Patient (Initial Visit)  . Fatigue    months  . Neck Pain       New Patient Visit SUBJECTIVE: HPI: Deborah [name] is an 53 y.o.female who is being seen for establishing care.   The patient was previously seen at Dr Dahlia Bailiff, transferring due to difficulty getting appt.  Over past ~9 mo, pt has been having fatigue. She feels both low energy and very sleepy. Diet/exercise have been unchanged, reports eating healthy overall. She has chronic knee pain, but it does not hinder her walking which she still does. No unexplained wt changes. Recent labs show she is not anemic and has nml b12 and D levels. She has hx of dep/anxiety and is followed by psych. Effexor dose increased to 75 mg/d last week and she notices marked improvement. No change in fatigue. She does not snore. Sleeps around 8-9 hrs nightly. No abn bleeding.  Hx of cerv stenosis. Still having tingling in hands. No stretches/exercises or PT. Wondering what she can do. Radiates to her head causing freq ha's. Follows w neuro.   Past Medical History:  Diagnosis Date  . Abdominal pain, generalized 11/28/2009   Qualifier: Diagnosis of  By: Trellis Paganini PA-c, Amy S   . Abdominal pain, left upper quadrant 05/29/2008   Qualifier: Diagnosis of  By: Nelson-Smith CMA (AAMA), Dottie    . ABDOMINAL PAIN-EPIGASTRIC 11/28/2009   Qualifier: Diagnosis of  By: Trellis Paganini PA-c, Amy S   . Abnormal weight gain 11/28/2009   Qualifier: Diagnosis of  By: Trellis Paganini PA-c, Amy S   . B12 DEFICIENCY 12/20/2008   Qualifier: Diagnosis of  By: Marland Mcalpine    . Chronic constipation   . Chronic pancreatitis ([place]) LOW-GRADE   FOLLOWED BY DR Sydell Axon BRODIE  . CONSTIPATION 11/28/2009   Qualifier: Diagnosis of  By: Trellis Paganini PA-c, Amy S   . GENITAL HERPES 05/11/2007   Qualifier: Diagnosis of  By: Mickle Asper, Ramond Craver    . Genital herpes in women   . Hot flashes   . IBS (irritable bowel syndrome)   . Irritable bowel  syndrome 05/11/2007   Qualifier: Diagnosis of  By: Arsenio Loader RN, Ramond Craver    . Menorrhagia   . Nausea alone 11/28/2009   Qualifier: Diagnosis of  By: Trellis Paganini PA-c, Amy S   . PANCREATITIS 05/11/2007   Qualifier: Diagnosis of  By: Arsenio Loader RN, Ramond Craver    . Perimenopausal   . SCOLIOSIS 05/23/2008   Qualifier: Diagnosis of  By: Harlon Ditty CMA (AAMA), Dottie    . Seasonal allergies   . Thrombocytosis ([place]) 07/13/2016   Past Surgical History:  Procedure Laterality Date  . ABLATION  2014  . APPENDECTOMY  5/21/197   Incidental appy  . KNEE ARTHROSCOPY  2004   RIGHT  . 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 Ulcerative colitis Mother   . Cancer Mother        waldenstroms  . Cancer Father        prostate cancer  . CAD Father   . Diverticulitis Father   . Depression Father   . Cancer Paternal Grandmother        Ovarian cancer  . Depression Sister   . Hearing loss Maternal Grandmother   . Diabetes Paternal Grandfather    Allergies  Allergen Reactions  . Ivp Dye [Iodinated Diagnostic Agents] Swelling  Eyes swell  . Ortho Tri-Cyclen [Norgestimate-Eth Estradiol] Hives  . Penicillins Hives    Current Outpatient Medications:  .  aspirin EC 81 MG tablet, Take 1 tablet (81 mg total) by mouth daily., Disp:  , Rfl:  .  Cholecalciferol (VITAMIN D3) 1000 units CAPS, Take 3,000 Units by mouth daily. , Disp: , Rfl:  .  Docusate Calcium (STOOL SOFTENER PO), Take 1 tablet by mouth daily. , Disp: , Rfl:  .  fluticasone (FLONASE) 50 MCG/ACT nasal spray, Place 2 sprays into the nose daily., Disp: , Rfl:  .  levocetirizine (XYZAL) 5 MG tablet, Take 5 mg by mouth every evening., Disp: , Rfl:  .  montelukast (SINGULAIR) 10 MG tablet, Take 10 mg by mouth at bedtime., Disp: , Rfl:  .  Probiotic Product (ALIGN PO), Take by mouth daily., Disp: , Rfl:  .  valACYclovir (VALTREX) 500 MG tablet, Take 500 mg by mouth  daily., Disp: , Rfl:  .  vitamin B-12 (CYANOCOBALAMIN) 1000 MCG tablet, Take 1,000 mcg by mouth 3 (three) times a week. , Disp: , Rfl:  .  zolpidem (AMBIEN) 5 MG tablet, TK 1 T PO NIGHTLY, Disp: , Rfl:  .  atorvastatin (LIPITOR) 10 MG tablet, Take 1 tablet (10 mg total) by mouth daily., Disp: 90 tablet, Rfl: 1 .  venlafaxine XR (EFFEXOR-XR) 75 MG 24 hr capsule, Take 1 capsule (75 mg total) by mouth daily with breakfast., Disp:  , Rfl:   ROS MSK: +neck pain Lungs: no sob Endo: No wt changes Const: no fevers  OBJECTIVE: BP 102/64 (BP Location: Left Arm, Patient Position: Sitting, Cuff Size: Normal)   Pulse 63   Temp (!) 96.9 F (36.1 C) (Temporal)   Ht 5\' 3"  (1.6 m)   Wt 143 lb 8 oz (65.1 kg)   SpO2 97%   BMI 25.42 kg/m  General:  well developed, well nourished, in no apparent distress Skin:  no significant moles, warts, or growths Head:  no masses, lesions, or tenderness Eyes:  pupils equal and round, sclera anicteric without injection Throat/Pharynx:  lips and gingiva without lesion; tongue and uvula midline; non-inflamed pharynx; no exudates or postnasal drainage Neck: neck supple without adenopathy, thyromegaly, or masses Lungs:  clear to auscultation, breath sounds equal bilaterally, no respiratory distress Cardio:  regular rate and rhythm, no LE edema or bruits Abd: BS+, S, NT, ND Musculoskeletal:  symmetrical muscle groups noted without atrophy or deformity Extremities:  no clubbing, cyanosis, or edema, no deformities, no skin discoloration Neuro:  gait normal; deep tendon reflexes normal and symmetric Psych: well oriented with normal range of affect and appropriate judgment/insight  ASSESSMENT/PLAN: Fatigue, unspecified type - Plan: TSH, T4, free  Cervical stenosis of spinal canal  1- Ck T4. Rec closer to 7 hrs/night to see if she is "oversleeping". Counseled on diet and exercise. Will rec pulm referral to sleep eval if so. Doubt meds, dep/anx, diet/exercise is issue.   2- stretches/exercises. If no improvement over next 3-4 weeks, will refer to PT.  Patient should return in 3 mo for CPE. The patient voiced understanding and agreement to the plan.   [place], DO 03/12/19  8:38 AM

## 2019-03-12 NOTE — Patient Instructions (Addendum)
Heat (pad or rice pillow in microwave) over affected area, 10-15 minutes twice daily.   Let me know if you would like to see PT. I would give the stretches 2-4 weeks.  Give [place] 2-3 business days to get the results of your labs back.   Try to get closer to 7 hrs of sleep nightly to see what happens.   Keep the diet clean and stay active.  EXERCISES RANGE OF MOTION (ROM) AND STRETCHING EXERCISES  These exercises may help you when beginning to rehabilitate your issue. In order to successfully resolve your symptoms, you must improve your posture. These exercises are designed to help reduce the forward-head and rounded-shoulder posture which contributes to this condition. Your symptoms may resolve with or without further involvement from your physician, physical therapist or athletic trainer. While completing these exercises, remember:   Restoring tissue flexibility helps normal motion to return to the joints. This allows healthier, less painful movement and activity.  An effective stretch should be held for at least 20 seconds, although you may need to begin with shorter hold times for comfort.  A stretch should never be painful. You should only feel a gentle lengthening or release in the stretched tissue.  Do not do any stretch or exercise that you cannot tolerate.  STRETCH- Axial Extensors  Lie on your back on the floor. You may bend your knees for comfort. Place a rolled-up hand towel or dish towel, about 2 inches in diameter, under the part of your head that makes contact with the floor.  Gently tuck your chin, as if trying to make a "double chin," until you feel a gentle stretch at the base of your head.  Hold 15-20 seconds. Repeat 2-3 times. Complete this exercise 1 time per day.   STRETCH - Axial Extension   Stand or sit on a firm surface. Assume a good posture: chest up, shoulders drawn back, abdominal muscles slightly tense, knees unlocked (if standing) and feet hip width apart.   Slowly retract your chin so your head slides back and your chin slightly lowers. Continue to look straight ahead.  You should feel a gentle stretch in the back of your head. Be certain not to feel an aggressive stretch since this can cause headaches later.  Hold for 15-20 seconds. Repeat 2-3 times. Complete this exercise 1 time per day.  STRETCH - Cervical Side Bend   Stand or sit on a firm surface. Assume a good posture: chest up, shoulders drawn back, abdominal muscles slightly tense, knees unlocked (if standing) and feet hip width apart.  Without letting your nose or shoulders move, slowly tip your right / left ear to your shoulder until your feel a gentle stretch in the muscles on the opposite side of your neck.  Hold 15-20 seconds. Repeat 2-3 times. Complete this exercise 1-2 times per day.  STRETCH - Cervical Rotators   Stand or sit on a firm surface. Assume a good posture: chest up, shoulders drawn back, abdominal muscles slightly tense, knees unlocked (if standing) and feet hip width apart.  Keeping your eyes level with the ground, slowly turn your head until you feel a gentle stretch along the back and opposite side of your neck.  Hold 15-20 seconds. Repeat 2-3 times. Complete this exercise 1-2 times per day.  RANGE OF MOTION - Neck Circles   Stand or sit on a firm surface. Assume a good posture: chest up, shoulders drawn back, abdominal muscles slightly tense, knees unlocked (if standing) and  feet hip width apart.  Gently roll your head down and around from the back of one shoulder to the back of the other. The motion should never be forced or painful.  Repeat the motion 10-20 times, or until you feel the neck muscles relax and loosen. Repeat 2-3 times. Complete the exercise 1-2 times per day. STRENGTHENING EXERCISES - Cervical Strain and Sprain These exercises may help you when beginning to rehabilitate your injury. They may resolve your symptoms with or without  further involvement from your physician, physical therapist, or athletic trainer. While completing these exercises, remember:   Muscles can gain both the endurance and the strength needed for everyday activities through controlled exercises.  Complete these exercises as instructed by your physician, physical therapist, or athletic trainer. Progress the resistance and repetitions only as guided.  You may experience muscle soreness or fatigue, but the pain or discomfort you are trying to eliminate should never worsen during these exercises. If this pain does worsen, stop and make certain you are following the directions exactly. If the pain is still present after adjustments, discontinue the exercise until you can discuss the trouble with your clinician.  STRENGTH - Cervical Flexors, Isometric  Face a wall, standing about 6 inches away. Place a small pillow, a ball about 6-8 inches in diameter, or a folded towel between your forehead and the wall.  Slightly tuck your chin and gently push your forehead into the soft object. Push only with mild to moderate intensity, building up tension gradually. Keep your jaw and forehead relaxed.  Hold 10 to 20 seconds. Keep your breathing relaxed.  Release the tension slowly. Relax your neck muscles completely before you start the next repetition. Repeat 2-3 times. Complete this exercise 1 time per day.  STRENGTH- Cervical Lateral Flexors, Isometric   Stand about 6 inches away from a wall. Place a small pillow, a ball about 6-8 inches in diameter, or a folded towel between the side of your head and the wall.  Slightly tuck your chin and gently tilt your head into the soft object. Push only with mild to moderate intensity, building up tension gradually. Keep your jaw and forehead relaxed.  Hold 10 to 20 seconds. Keep your breathing relaxed.  Release the tension slowly. Relax your neck muscles completely before you start the next repetition. Repeat 2-3  times. Complete this exercise 1 time per day.  STRENGTH - Cervical Extensors, Isometric   Stand about 6 inches away from a wall. Place a small pillow, a ball about 6-8 inches in diameter, or a folded towel between the back of your head and the wall.  Slightly tuck your chin and gently tilt your head back into the soft object. Push only with mild to moderate intensity, building up tension gradually. Keep your jaw and forehead relaxed.  Hold 10 to 20 seconds. Keep your breathing relaxed.  Release the tension slowly. Relax your neck muscles completely before you start the next repetition. Repeat 2-3 times. Complete this exercise 1 time per day.  POSTURE AND BODY MECHANICS CONSIDERATIONS Keeping correct posture when sitting, standing or completing your activities will reduce the stress put on different body tissues, allowing injured tissues a chance to heal and limiting painful experiences. The following are general guidelines for improved posture. Your physician or physical therapist will provide you with any instructions specific to your needs. While reading these guidelines, remember:  The exercises prescribed by your provider will help you have the flexibility and strength to maintain correct  postures.  The correct posture provides the optimal environment for your joints to work. All of your joints have less wear and tear when properly supported by a spine with good posture. This means you will experience a healthier, less painful body.  Correct posture must be practiced with all of your activities, especially prolonged sitting and standing. Correct posture is as important when doing repetitive low-stress activities (typing) as it is when doing a single heavy-load activity (lifting).  PROLONGED STANDING WHILE SLIGHTLY LEANING FORWARD When completing a task that requires you to lean forward while standing in one place for a long time, place either foot up on a stationary 2- to 4-inch high  object to help maintain the best posture. When both feet are on the ground, the low back tends to lose its slight inward curve. If this curve flattens (or becomes too large), then the back and your other joints will experience too much stress, fatigue more quickly, and can cause pain.   RESTING POSITIONS Consider which positions are most painful for you when choosing a resting position. If you have pain with flexion-based activities (sitting, bending, stooping, squatting), choose a position that allows you to rest in a less flexed posture. You would want to avoid curling into a fetal position on your side. If your pain worsens with extension-based activities (prolonged standing, working overhead), avoid resting in an extended position such as sleeping on your stomach. Most people will find more comfort when they rest with their spine in a more neutral position, neither too rounded nor too arched. Lying on a non-sagging bed on your side with a pillow between your knees, or on your back with a pillow under your knees will often provide some relief. Keep in mind, being in any one position for a prolonged period of time, no matter how correct your posture, can still lead to stiffness.  WALKING Walk with an upright posture. Your ears, shoulders, and hips should all line up. OFFICE WORK When working at a desk, create an environment that supports good, upright posture. Without extra support, muscles fatigue and lead to excessive strain on joints and other tissues.  CHAIR:  A chair should be able to slide under your desk when your back makes contact with the back of the chair. This allows you to work closely.  The chair's height should allow your eyes to be level with the upper part of your monitor and your hands to be slightly lower than your elbows.  Body position: ? Your feet should make contact with the floor. If this is not possible, use a foot rest. ? Keep your ears over your shoulders. This will  reduce stress on your neck and low back.

## 2019-04-02 ENCOUNTER — Other Ambulatory Visit: Payer: Self-pay

## 2019-04-02 ENCOUNTER — Ambulatory Visit: Payer: 59 | Attending: Family

## 2019-04-02 DIAGNOSIS — Z20822 Contact with and (suspected) exposure to covid-19: Secondary | ICD-10-CM

## 2019-04-03 LAB — SPECIMEN STATUS REPORT

## 2019-04-03 LAB — NOVEL CORONAVIRUS, NAA: SARS-CoV-2, NAA: DETECTED — AB

## 2019-05-20 ENCOUNTER — Other Ambulatory Visit: Payer: Self-pay | Admitting: Cardiology

## 2019-06-05 ENCOUNTER — Encounter: Payer: 59 | Admitting: Family Medicine

## 2019-07-30 ENCOUNTER — Encounter: Payer: 59 | Admitting: Family Medicine

## 2019-08-27 ENCOUNTER — Ambulatory Visit (INDEPENDENT_AMBULATORY_CARE_PROVIDER_SITE_OTHER): Payer: 59 | Admitting: Family Medicine

## 2019-08-27 ENCOUNTER — Other Ambulatory Visit: Payer: Self-pay

## 2019-08-27 ENCOUNTER — Encounter: Payer: Self-pay | Admitting: Family Medicine

## 2019-08-27 VITALS — BP 120/80 | HR 75 | Temp 95.2°F | Ht 63.0 in | Wt 147.5 lb

## 2019-08-27 DIAGNOSIS — Z0000 Encounter for general adult medical examination without abnormal findings: Secondary | ICD-10-CM | POA: Diagnosis not present

## 2019-08-27 DIAGNOSIS — R5383 Other fatigue: Secondary | ICD-10-CM

## 2019-08-27 DIAGNOSIS — G4700 Insomnia, unspecified: Secondary | ICD-10-CM

## 2019-08-27 DIAGNOSIS — M722 Plantar fascial fibromatosis: Secondary | ICD-10-CM

## 2019-08-27 DIAGNOSIS — Q8901 Asplenia (congenital): Secondary | ICD-10-CM

## 2019-08-27 HISTORY — DX: Plantar fascial fibromatosis: M72.2

## 2019-08-27 HISTORY — DX: Insomnia, unspecified: G47.00

## 2019-08-27 HISTORY — DX: Asplenia (congenital): Q89.01

## 2019-08-27 LAB — CBC
HCT: 37.6 % (ref 36.0–46.0)
Hemoglobin: 12.8 g/dL (ref 12.0–15.0)
MCHC: 34.2 g/dL (ref 30.0–36.0)
MCV: 101.8 fl — ABNORMAL HIGH (ref 78.0–100.0)
Platelets: 445 10*3/uL — ABNORMAL HIGH (ref 150.0–400.0)
RBC: 3.69 Mil/uL — ABNORMAL LOW (ref 3.87–5.11)
RDW: 13.8 % (ref 11.5–15.5)
WBC: 10.3 10*3/uL (ref 4.0–10.5)

## 2019-08-27 LAB — COMPREHENSIVE METABOLIC PANEL
ALT: 16 U/L (ref 0–35)
AST: 16 U/L (ref 0–37)
Albumin: 4.2 g/dL (ref 3.5–5.2)
Alkaline Phosphatase: 72 U/L (ref 39–117)
BUN: 21 mg/dL (ref 6–23)
CO2: 28 mEq/L (ref 19–32)
Calcium: 9.3 mg/dL (ref 8.4–10.5)
Chloride: 104 mEq/L (ref 96–112)
Creatinine, Ser: 0.82 mg/dL (ref 0.40–1.20)
GFR: 72.91 mL/min (ref >60.00)
Glucose, Bld: 95 mg/dL (ref 70–99)
Potassium: 4.1 mEq/L (ref 3.5–5.1)
Sodium: 138 mEq/L (ref 135–145)
Total Bilirubin: 0.4 mg/dL (ref 0.2–1.2)
Total Protein: 6.5 g/dL (ref 6.0–8.3)

## 2019-08-27 LAB — LIPID PANEL
Cholesterol: 163 mg/dL (ref 0–200)
HDL: 61.1 mg/dL (ref >39.00)
LDL Cholesterol: 86 mg/dL (ref 0–99)
NonHDL: 102.35
Total CHOL/HDL Ratio: 3
Triglycerides: 81 mg/dL (ref 0.0–149.0)
VLDL: 16.2 mg/dL (ref 0.0–40.0)

## 2019-08-27 NOTE — Patient Instructions (Addendum)
Give [place] 2-3 business days to get the results of your labs back.   Keep the diet clean and stay active.  The new Shingrix vaccine (for shingles) is a 2 shot series. It can make people feel low energy, achy and almost like they have the flu for 48 hours after injection. Please plan accordingly when deciding on when to get this shot. Call our office for a nurse visit appointment to get this. The second shot of the series is less severe regarding the side effects, but it still lasts 48 hours.   Let us know if you need anything.  Consider a [place] sock for nighttime.   Plantar Fasciitis Stretches/exercises Do exercises exactly as told by your health care provider and adjust them as directed. It is normal to feel mild stretching, pulling, tightness, or discomfort as you do these exercises, but you should stop right away if you feel sudden pain or your pain gets worse.   Stretching and range of motion exercises These exercises warm up your muscles and joints and improve the movement and flexibility of your foot. These exercises also help to relieve pain.  Exercise A: Plantar fascia stretch 1. Sit with your left / right leg crossed over your opposite knee. 2. Hold your heel with one hand with that thumb near your arch. With your other hand, hold your toes and gently pull them back toward the top of your foot. You should feel a stretch on the bottom of your toes or your foot or both. 3. Hold this stretch for 30 seconds. 4. Slowly release your toes and return to the starting position. Repeat 2 times. Complete this exercise 3 times per week.  Exercise B: Gastroc, standing 1. Stand with your hands against a wall. 2. Extend your left / right leg behind you, and bend your front knee slightly. 3. Keeping your heels on the floor and keeping your back knee straight, shift your weight toward the wall without arching your back. You should feel a gentle stretch in your left / right calf. 4. Hold this  position for 30 seconds. Repeat 2 times. Complete this exercise 3 times a week. Exercise C: Soleus, standing 1. Stand with your hands against a wall. 2. Extend your left / right leg behind you, and bend your front knee slightly. 3. Keeping your heels on the floor, bend your back knee and slightly shift your weight over the back leg. You should feel a gentle stretch deep in your calf. 4. Hold this position for 30 seconds. Repeat 2 times. Complete this exercise 3 times per week. Exercise D: Gastrocsoleus, standing 1. Stand with the ball of your left / right foot on a step. The ball of your foot is on the walking surface, right under your toes. 2. Keep your other foot firmly on the same step. 3. Hold onto the wall or a railing for balance. 4. Slowly lift your other foot, allowing your body weight to press your heel down over the edge of the step. You should feel a stretch in your left / right calf. 5. Hold this position for 30 seconds. 6. Return both feet to the step. 7. Repeat this exercise with a slight bend in your left / right knee. Repeat 2 times with your left / right knee straight and 2times with your left / right knee bent. Complete this exercise 3 times a week.  Balance exercise This exercise builds your balance and strength control of your arch to help take pressure  off your plantar fascia. Exercise E: Single leg stand 1. Without shoes, stand near a railing or in a doorway. You may hold onto the railing or door frame as needed. 2. Stand on your left / right foot. Keep your big toe down on the floor and try to keep your arch lifted. Do not let your foot roll inward. 3. Hold this position for 30 seconds. 4. If this exercise is too easy, you can try it with your eyes closed or while standing on a pillow. Repeat 2 times. Complete this exercise 3 times per week. This information is not intended to replace advice given to you by your health care provider. Make sure you discuss any  questions you have with your health care provider. Document Released: 03/08/2005 Document Revised: 11/11/2015 Document Reviewed: 01/20/2015 Elsevier Interactive Patient Education  2017 [employer].

## 2019-08-27 NOTE — Progress Notes (Signed)
Chief Complaint  Patient presents with  . Annual Exam  . Fatigue     Well Woman Deborah [name] is here for a complete physical.   Her last physical was >1 year ago.  Current diet: in general, diet is fair. Current exercise: walking. Weight is stable and she does have fatigue. Seatbelt? Yes  Health Maintenance Pap/HPV- Yes - last year Mammogram- Yes - last year Colon cancer screening-Yes - 5 year f/u; done  Shingrix- Yes Tetanus- Yes HIV screening- Yes  Past Medical History:  Diagnosis Date  . Abdominal pain, generalized 11/28/2009   Qualifier: Diagnosis of  By: Trellis Paganini PA-c, Amy S   . Abdominal pain, left upper quadrant 05/29/2008   Qualifier: Diagnosis of  By: Nelson-Smith CMA (AAMA), Dottie    . ABDOMINAL PAIN-EPIGASTRIC 11/28/2009   Qualifier: Diagnosis of  By: Trellis Paganini PA-c, Amy S   . Abnormal weight gain 11/28/2009   Qualifier: Diagnosis of  By: Trellis Paganini PA-c, Amy S   . B12 DEFICIENCY 12/20/2008   Qualifier: Diagnosis of  By: Marland Mcalpine    . Chronic constipation   . Chronic pancreatitis ([place]) LOW-GRADE   FOLLOWED BY DR Sydell Axon BRODIE  . CONSTIPATION 11/28/2009   Qualifier: Diagnosis of  By: Trellis Paganini PA-c, Amy S   . GENITAL HERPES 05/11/2007   Qualifier: Diagnosis of  By: Mickle Asper, Ramond Craver    . Genital herpes in women   . Hot flashes   . IBS (irritable bowel syndrome)   . Irritable bowel syndrome 05/11/2007   Qualifier: Diagnosis of  By: Arsenio Loader RN, Ramond Craver    . Menorrhagia   . Nausea alone 11/28/2009   Qualifier: Diagnosis of  By: Trellis Paganini PA-c, Amy S   . PANCREATITIS 05/11/2007   Qualifier: Diagnosis of  By: Arsenio Loader RN, Ramond Craver    . Perimenopausal   . SCOLIOSIS 05/23/2008   Qualifier: Diagnosis of  By: Harlon Ditty CMA (AAMA), Dottie    . Seasonal allergies   . Thrombocytosis ([place]) 07/13/2016     Past Surgical History:  Procedure Laterality Date  . ABLATION  2014  . APPENDECTOMY  5/21/197   Incidental appy  . KNEE ARTHROSCOPY  2004   RIGHT  .  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 aspirin EC 81 MG tablet Take 1 tablet (81 mg total) by mouth daily.    Marland Kitchen atorvastatin (LIPITOR) 10 MG tablet TAKE 1 TABLET(10 MG) BY MOUTH DAILY 90 tablet 1  . Cholecalciferol (VITAMIN D3) 1000 units CAPS Take 3,000 Units by mouth daily.     Mariane Baumgarten Calcium (STOOL SOFTENER PO) Take 1 tablet by mouth daily.     . fluticasone (FLONASE) 50 MCG/ACT nasal spray Place 2 sprays into the nose daily.    Marland Kitchen levocetirizine (XYZAL) 5 MG tablet Take 5 mg by mouth every evening.    . montelukast (SINGULAIR) 10 MG tablet Take 10 mg by mouth at bedtime.    . Probiotic Product (ALIGN PO) Take by mouth daily.    . valACYclovir (VALTREX) 500 MG tablet Take 500 mg by mouth daily.    Marland Kitchen venlafaxine XR (EFFEXOR-XR) 75 MG 24 hr capsule Take 1 capsule (75 mg total) by mouth daily with breakfast.    . vitamin B-12 (CYANOCOBALAMIN) 1000 MCG tablet Take 1,000 mcg by mouth 3 (three) times a week.     Marland Kitchen  zolpidem (AMBIEN) 5 MG tablet TK 1 T PO NIGHTLY     Allergies Allergies  Allergen Reactions  . Ivp Dye [Iodinated Diagnostic Agents] Swelling    Eyes swell  . Ortho Tri-Cyclen [Norgestimate-Eth Estradiol] Hives  . Penicillins Hives    Review of Systems: Constitutional:  no unexpected weight changes Eye:  no recent significant change in vision Ear/Nose/Mouth/Throat:  Ears:  no recent change in hearing Nose/Mouth/Throat:  no complaints of nasal congestion, no sore throat Cardiovascular: no chest pain Respiratory:  no shortness of breath Gastrointestinal:  +chronic pain/bowel issues GU:  Female: negative for dysuria or pelvic pain Musculoskeletal/Extremities:  +b/l PF Integumentary (Skin/Breast):  +bites on legs from being outside Neurologic:  no headaches Endocrine:  + fatigue Hematologic/Lymphatic:  No areas of easy  bleeding  Exam BP 120/80 (BP Location: Left Arm, Patient Position: Sitting, Cuff Size: Normal)   Pulse 75   Temp (!) 95.2 F (35.1 C) (Temporal)   Ht 5\' 3"  (1.6 m)   Wt 147 lb 8 oz (66.9 kg)   SpO2 97%   BMI 26.13 kg/m  General:  well developed, well nourished, in no apparent distress Skin:  no significant moles, warts, or growths Head:  no masses, lesions, or tenderness Eyes:  pupils equal and round, sclera anicteric without injection Ears:  canals without lesions, TMs shiny without retraction, no obvious effusion, no erythema Nose:  nares patent, septum midline, mucosa normal, and no drainage or sinus tenderness Throat/Pharynx:  lips and gingiva without lesion; tongue and uvula midline; non-inflamed pharynx; no exudates or postnasal drainage Neck: neck supple without adenopathy, thyromegaly, or masses Lungs:  clear to auscultation, breath sounds equal bilaterally, no respiratory distress Cardio:  regular rate and rhythm, no LE edema Abdomen:  abdomen soft, nontender; bowel sounds normal; no masses or organomegaly Genital: Defer to GYN Musculoskeletal:  symmetrical muscle groups noted without atrophy or deformity Extremities:  no clubbing, cyanosis, or edema, no deformities, no skin discoloration Neuro:  gait normal; deep tendon reflexes normal and symmetric Psych: well oriented with normal range of affect and appropriate judgment/insight  Assessment and Plan  Well adult exam - Plan: CBC, Comprehensive metabolic panel, Lipid panel  Insomnia, unspecified type - Plan: Ambulatory referral to Pulmonology   Well 54 y.o. female. Counseled on diet and exercise. Refer pulm for sleep eval as she is interested in coming off of Ambien. I think her fatigue will improve if they can help her with this.  She would like to come off of statin also. Will ck labs.  Other orders as above. Follow up in 6 mo. The patient voiced understanding and agreement to the plan.  [place],  DO 08/27/19 7:18 AM

## 2019-09-19 ENCOUNTER — Other Ambulatory Visit: Payer: Self-pay

## 2019-09-19 ENCOUNTER — Ambulatory Visit: Payer: 59 | Admitting: Cardiology

## 2019-09-19 ENCOUNTER — Encounter: Payer: Self-pay | Admitting: Cardiology

## 2019-09-19 VITALS — BP 112/78 | HR 64 | Ht 63.0 in | Wt 148.0 lb

## 2019-09-19 DIAGNOSIS — R011 Cardiac murmur, unspecified: Secondary | ICD-10-CM

## 2019-09-19 DIAGNOSIS — E782 Mixed hyperlipidemia: Secondary | ICD-10-CM | POA: Diagnosis not present

## 2019-09-19 MED ORDER — ATORVASTATIN CALCIUM 10 MG PO TABS: ORAL_TABLET | ORAL | 2 refills | Status: DC

## 2019-09-19 NOTE — Progress Notes (Signed)
Cardiology Office Note:    Date:  09/19/2019   ID:  [name], DOB 21-May-1965, MRN [id number]  PCP:  Shelda Pal, DO  Cardiologist:  Jenean Lindau, MD   Referring MD: Shelda Pal*    ASSESSMENT:    1. Mixed dyslipidemia   2. Cardiac murmur    PLAN:    In order of problems listed above:  1. Primary prevention stressed with the patient.  Importance of compliance with diet medication stressed and she vocalized understanding.  Importance of regular exercise stressed and once her plantar fasciitis is better she is going to get back to exercising routine 2. Recent lipids were reviewed and I discussed this with her.  In view of the fact that she is prediabetic or diet-controlled diabetic she is on statin therapy and wishes to continue and we refill her medications 3. Cardiac murmur: Echocardiogram was unremarkable and this is a benign or physiologic murmur I discussed this with her. 4. Patient will be seen in follow-up appointment in 9 months or earlier if the patient has any concerns    Medication Adjustments/Labs and Tests Ordered: Current medicines are reviewed at length with the patient today.  Concerns regarding medicines are outlined above.  No orders of the defined types were placed in this encounter.  No orders of the defined types were placed in this encounter.    Chief Complaint  Patient presents with  . Follow-up     History of Present Illness:    Deborah [name] is a 54 y.o. female.  Patient was evaluated by me for chest discomfort and cardiac murmur.  She also has dyslipidemia and diet-controlled diabetes.  She denies any problems at this time.  She walks on a regular basis but has had issues with plantar fasciitis so she has slowed down some.  At the time of my evaluation, the patient is alert awake oriented and in no distress.  Past Medical History:  Diagnosis Date  . Abdominal pain, generalized 11/28/2009   Qualifier:  Diagnosis of  By: Trellis Paganini PA-c, Amy S   . Abdominal pain, left upper quadrant 05/29/2008   Qualifier: Diagnosis of  By: Nelson-Smith CMA (AAMA), Dottie    . ABDOMINAL PAIN-EPIGASTRIC 11/28/2009   Qualifier: Diagnosis of  By: Trellis Paganini PA-c, Amy S   . Abnormal weight gain 11/28/2009   Qualifier: Diagnosis of  By: Trellis Paganini PA-c, Amy S   . B12 DEFICIENCY 12/20/2008   Qualifier: Diagnosis of  By: Marland Mcalpine    . Chronic constipation   . Chronic pancreatitis ([place]) LOW-GRADE   FOLLOWED BY DR Sydell Axon BRODIE  . CONSTIPATION 11/28/2009   Qualifier: Diagnosis of  By: Trellis Paganini PA-c, Amy S   . GENITAL HERPES 05/11/2007   Qualifier: Diagnosis of  By: Mickle Asper, Ramond Craver    . Genital herpes in women   . Hot flashes   . IBS (irritable bowel syndrome)   . Irritable bowel syndrome 05/11/2007   Qualifier: Diagnosis of  By: Arsenio Loader RN, Ramond Craver    . Menorrhagia   . Nausea alone 11/28/2009   Qualifier: Diagnosis of  By: Trellis Paganini PA-c, Amy S   . PANCREATITIS 05/11/2007   Qualifier: Diagnosis of  By: Arsenio Loader RN, Ramond Craver    . Perimenopausal   . SCOLIOSIS 05/23/2008   Qualifier: Diagnosis of  By: Harlon Ditty CMA (AAMA), Dottie    . Seasonal allergies   . Thrombocytosis ([place]) 07/13/2016    Past Surgical History:  Procedure Laterality Date  . ABLATION  2014  . APPENDECTOMY  5/21/197   Incidental appy  . KNEE ARTHROSCOPY  2004   RIGHT  . 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 aspirin EC 81 MG tablet Take 1 tablet (81 mg total) by mouth daily.  Marland Kitchen atorvastatin (LIPITOR) 10 MG tablet TAKE 1 TABLET(10 MG) BY MOUTH DAILY  . Cholecalciferol (VITAMIN D3) 1000 units CAPS Take 3,000 Units by mouth daily.   Mariane Baumgarten Calcium (STOOL SOFTENER PO) Take 1 tablet by mouth daily.   . fluticasone (FLONASE) 50 MCG/ACT nasal spray Place 2 sprays into the nose daily.  Marland Kitchen levocetirizine (XYZAL) 5 MG tablet Take 5 mg by  mouth every evening.  . montelukast (SINGULAIR) 10 MG tablet Take 10 mg by mouth at bedtime.  . Probiotic Product (ALIGN PO) Take by mouth daily.  . valACYclovir (VALTREX) 500 MG tablet Take 500 mg by mouth daily.  Marland Kitchen venlafaxine XR (EFFEXOR-XR) 75 MG 24 hr capsule Take 1 capsule (75 mg total) by mouth daily with breakfast.  . vitamin B-12 (CYANOCOBALAMIN) 1000 MCG tablet Take 1,000 mcg by mouth 3 (three) times a week.   . zolpidem (AMBIEN) 5 MG tablet TK 1 T PO NIGHTLY     Allergies:   Ivp dye [iodinated diagnostic agents], Ortho tri-cyclen [norgestimate-eth estradiol], and Penicillins   Social History   Socioeconomic History  . Marital status: Single    Spouse name: Not on file  . Number of children: Not on file  . Years of education: Not on file  . Highest education level: Not on file  Occupational History  . Occupation: Licensed conveyancer  Tobacco Use  . Smoking status: Never Smoker  . Smokeless tobacco: Never Used  Substance and Sexual Activity  . Alcohol use: No  . Drug use: No  . Sexual activity: Not on file  Other Topics Concern  . Not on file  Social History Narrative  . Not on file   Social Determinants of Health   Financial Resource Strain:   . Difficulty of Paying Living Expenses:   Food Insecurity:   . Worried About Charity fundraiser in the Last Year:   . Arboriculturist in the Last Year:   Transportation Needs:   . Film/video editor (Medical):   Marland Kitchen Lack of Transportation (Non-Medical):   Physical Activity:   . Days of Exercise per Week:   . Minutes of Exercise per Session:   Stress:   . Feeling of Stress :   Social Connections:   . Frequency of Communication with Friends and Family:   . Frequency of Social Gatherings with Friends and Family:   . Attends Religious Services:   . Active Member of Clubs or Organizations:   . Attends Archivist Meetings:   Marland Kitchen Marital Status:      Family History: The patient's family history includes CAD in her  father; Cancer in her father, mother, and paternal grandmother; Depression in her father and sister; Diabetes in her paternal grandfather; Diverticulitis in her father; Hearing loss in her maternal grandmother; Ulcerative colitis in her mother.  ROS:   Please see the history of present illness.    All other systems reviewed and are negative.  EKGs/Labs/Other Studies Reviewed:    The following studies were reviewed today: I discussed my findings with the patient at length   Recent Labs: 11/01/2018: TSH 4.180 08/27/2019: ALT 16;  BUN 21; Creatinine, Ser 0.82; Hemoglobin 12.8; Platelets 445.0; Potassium 4.1; Sodium 138  Recent Lipid Panel    Component Value Date/Time   CHOL 163 08/27/2019 0739   CHOL 169 03/07/2019 0937   TRIG 81.0 08/27/2019 0739   HDL 61.10 08/27/2019 0739   HDL 72 03/07/2019 0937   CHOLHDL 3 08/27/2019 0739   VLDL 16.2 08/27/2019 0739   LDLCALC 86 08/27/2019 0739   LDLCALC 86 03/07/2019 0937    Physical Exam:    VS:  BP 112/78   Pulse 64   Ht 5\' 3"  (1.6 m)   Wt 148 lb (67.1 kg)   SpO2 98%   BMI 26.22 kg/m     Wt Readings from Last 3 Encounters:  09/19/19 148 lb (67.1 kg)  08/27/19 147 lb 8 oz (66.9 kg)  03/12/19 143 lb 8 oz (65.1 kg)     GEN: Patient is in no acute distress HEENT: Normal NECK: No JVD; No carotid bruits LYMPHATICS: No lymphadenopathy CARDIAC: Hear sounds regular, 2/6 systolic murmur at the apex. RESPIRATORY:  Clear to auscultation without rales, wheezing or rhonchi  ABDOMEN: Soft, non-tender, non-distended MUSCULOSKELETAL:  No edema; No deformity  SKIN: Warm and dry NEUROLOGIC:  Alert and oriented x 3 PSYCHIATRIC:  Normal affect   Signed, Jenean Lindau, MD  09/19/2019 1:32 PM    [facility]

## 2019-09-19 NOTE — Patient Instructions (Signed)
Medication Instructions:  No medication changes *If you need a refill on your cardiac medications before your next appointment, please call your pharmacy*   Lab Work: None ordered If you have labs (blood work) drawn today and your tests are completely normal, you will receive your results only by: . MyChart Message (if you have MyChart) OR . A paper copy in the mail If you have any lab test that is abnormal or we need to change your treatment, we will call you to review the results.   Testing/Procedures: None ordered   Follow-Up: At [facility], you and your health needs are our priority.  As part of our continuing mission to provide you with exceptional heart care, we have created designated Provider Care Teams.  These Care Teams include your primary Cardiologist (physician) and Advanced Practice Providers (APPs -  Physician Assistants and Nurse Practitioners) who all work together to provide you with the care you need, when you need it.  We recommend signing up for the patient portal called "MyChart".  Sign up information is provided on this After Visit Summary.  MyChart is used to connect with patients for Virtual Visits (Telemedicine).  Patients are able to view lab/test results, encounter notes, upcoming appointments, etc.  Non-urgent messages can be sent to your provider as well.   To learn more about what you can do with MyChart, go to [email or web address].    Your next appointment:   9 month(s)  The format for your next appointment:   In Person  Provider:   Rajan Revankar, MD   Other Instructions NA 

## 2019-10-03 ENCOUNTER — Telehealth: Payer: Self-pay | Admitting: Family Medicine

## 2019-10-03 NOTE — Telephone Encounter (Signed)
Patient called to update address in epic

## 2019-10-04 ENCOUNTER — Telehealth: Payer: Self-pay | Admitting: Hematology and Oncology

## 2019-10-04 NOTE — Telephone Encounter (Signed)
Unable to reach pt to reschedule per 7/14 sch message. Left voicemail for pt to call office back.

## 2019-10-08 ENCOUNTER — Telehealth: Payer: Self-pay | Admitting: Hematology and Oncology

## 2019-10-08 NOTE — Telephone Encounter (Signed)
Returned call from 7/16 vmail. Left message for patient to call back if reschedule is still needed

## 2019-11-14 ENCOUNTER — Other Ambulatory Visit: Payer: Self-pay

## 2019-11-14 ENCOUNTER — Other Ambulatory Visit: Payer: Self-pay | Admitting: Gastroenterology

## 2019-11-14 ENCOUNTER — Ambulatory Visit
Admission: RE | Admit: 2019-11-14 | Discharge: 2019-11-14 | Disposition: A | Discharge status: Home / Self Care | Payer: 59 | Source: Ambulatory Visit | Attending: Gastroenterology | Admitting: Gastroenterology

## 2019-11-14 DIAGNOSIS — K5902 Outlet dysfunction constipation: Secondary | ICD-10-CM

## 2019-11-14 DIAGNOSIS — R109 Unspecified abdominal pain: Secondary | ICD-10-CM

## 2019-11-20 ENCOUNTER — Ambulatory Visit: Payer: 59 | Admitting: Hematology and Oncology

## 2019-11-20 ENCOUNTER — Other Ambulatory Visit: Payer: 59

## 2019-11-30 ENCOUNTER — Other Ambulatory Visit: Payer: Self-pay | Admitting: Hematology and Oncology

## 2019-11-30 DIAGNOSIS — D75839 Thrombocytosis, unspecified: Secondary | ICD-10-CM

## 2019-12-02 NOTE — Progress Notes (Signed)
Patient Care Team: Shelda Pal, DO as PCP - General (Family Medicine)  DIAGNOSIS:    ICD-10-CM   1. Thrombocytosis (HCC)  D47.3 Flow Cytometry    CBC with Differential (Cancer Center Only)    CMP ([place] only)    Lactate dehydrogenase    CHIEF COMPLIANT: Follow-up of thrombocytosis  INTERVAL HISTORY: Deborah [name] is a 54 y.o. with above-mentioned history of thrombocytosis. She presents to the clinic today for follow-up.   She continues to have fatigue issues and sweating probably menopausal symptoms.  Denies any bruising or bleeding or clotting issues.  ALLERGIES:  is allergic to ivp dye [iodinated diagnostic agents], ortho tri-cyclen [norgestimate-eth estradiol], and penicillins.  MEDICATIONS:  Current Outpatient Medications  Medication Sig Dispense Refill  . aspirin EC 81 MG tablet Take 1 tablet (81 mg total) by mouth daily.    Marland Kitchen atorvastatin (LIPITOR) 10 MG tablet TAKE 1 TABLET(10 MG) BY MOUTH DAILY 90 tablet 2  . Cholecalciferol (VITAMIN D3) 1000 units CAPS Take 3,000 Units by mouth daily.     Mariane Baumgarten Calcium (STOOL SOFTENER PO) Take 1 tablet by mouth daily.     . fluticasone (FLONASE) 50 MCG/ACT nasal spray Place 2 sprays into the nose daily.    Marland Kitchen levocetirizine (XYZAL) 5 MG tablet Take 5 mg by mouth every evening.    . montelukast (SINGULAIR) 10 MG tablet Take 10 mg by mouth at bedtime.    . Probiotic Product (ALIGN PO) Take by mouth daily.    . valACYclovir (VALTREX) 500 MG tablet Take 500 mg by mouth daily.    Marland Kitchen venlafaxine XR (EFFEXOR-XR) 75 MG 24 hr capsule Take 1 capsule (75 mg total) by mouth daily with breakfast.    . vitamin B-12 (CYANOCOBALAMIN) 1000 MCG tablet Take 1,000 mcg by mouth 3 (three) times a week.     . zolpidem (AMBIEN) 5 MG tablet TK 1 T PO NIGHTLY     No current facility-administered medications for this visit.    PHYSICAL EXAMINATION: ECOG PERFORMANCE STATUS: 1 - Symptomatic but completely ambulatory  Vitals:    12/03/19 0824  BP: 97/70  Pulse: (!) 56  Resp: 17  Temp: (!) 96 F (35.6 C)  SpO2: 100%   Filed Weights   12/03/19 0824  Weight: 148 lb 8 oz (67.4 kg)    LABORATORY DATA:  I have reviewed the data as listed CMP Latest Ref Rng & Units 08/27/2019 03/07/2019 11/01/2018  Glucose 70 - 99 mg/dL 95 92 88  BUN 6 - 23 mg/dL 21 15 14   Creatinine 0.40 - 1.20 mg/dL 0.82 0.73 0.77  Sodium 135 - 145 mEq/L 138 138 137  Potassium 3.5 - 5.1 mEq/L 4.1 4.4 4.2  Chloride 96 - 112 mEq/L 104 102 100  CO2 19 - 32 mEq/L 28 22 23   Calcium 8.4 - 10.5 mg/dL 9.3 9.7 9.6  Total Protein 6.0 - 8.3 g/dL 6.5 6.7 6.6  Total Bilirubin 0.2 - 1.2 mg/dL 0.4 0.4 0.4  Alkaline Phos 39 - 117 U/L 72 82 90  AST 0 - 37 U/L 16 22 24   ALT 0 - 35 U/L 16 23 23     Lab Results  Component Value Date   WBC 10.6 (H) 12/03/2019   HGB 13.4 12/03/2019   HCT 38.8 12/03/2019   MCV 97.7 12/03/2019   PLT 486 (H) 12/03/2019   NEUTROABS 3.5 12/03/2019    ASSESSMENT & PLAN:  Thrombocytosis (HCC) Thrombocytosis: Review of the labs revealed platelet counts469 on  06/07/2016.Patient has had mild elevation of plateletsatleastfor the last 4 years (538 in 2014; 571 in 2016 and 490 six months ago) Platelet count 11/01/2018: 494 Platelet count 12/03/2019: 486  Platelet count has not changed much in the last 7 years.  Probable cause: Splenectomy 20 years ago  Lymphocytosis: I suspect CLL.  It could also be reactive because worse or vaccination.  We will obtain flow cytometry for further evaluation.  Severe fatigue with night sweats: We discussed that this could be menopausal symptoms or it could be related to CLL.  Although I would not suspect that she would have such symptoms at [facility] of 5.7.  Given her other cardiac comorbidities including blood sugars, cholesterol etc. currently on aspirin 81 mg daily. I will call her with the result of the flow cytometry. Return to clinic in 1 year for follow-up    Orders Placed This  Encounter  Procedures  . Flow Cytometry    For CLL    Standing Status:   Future    Standing Expiration Date:   12/02/2020  . CBC with Differential (Cancer Center Only)    Standing Status:   Future    Standing Expiration Date:   12/02/2020  . CMP ([place] only)    Standing Status:   Future    Standing Expiration Date:   12/02/2020  . Lactate dehydrogenase    Standing Status:   Future    Standing Expiration Date:   12/02/2020   The patient has a good understanding of the overall plan. she agrees with it. she will call with any problems that may develop before the next visit here.  Total time spent: 20 mins including face to face time and time spent for planning, charting and coordination of care  Nicholas Lose, MD 12/03/2019  I, Cloyde Reams Dorshimer, am acting as scribe for Dr. Nicholas Lose.  I have reviewed the above documentation for accuracy and completeness, and I agree with the above.

## 2019-12-03 ENCOUNTER — Inpatient Hospital Stay (HOSPITAL_BASED_OUTPATIENT_CLINIC_OR_DEPARTMENT_OTHER): Payer: 59 | Admitting: Hematology and Oncology

## 2019-12-03 ENCOUNTER — Other Ambulatory Visit: Payer: Self-pay

## 2019-12-03 ENCOUNTER — Inpatient Hospital Stay: Payer: 59 | Attending: Hematology and Oncology

## 2019-12-03 DIAGNOSIS — D473 Essential (hemorrhagic) thrombocythemia: Secondary | ICD-10-CM | POA: Insufficient documentation

## 2019-12-03 DIAGNOSIS — Z7982 Long term (current) use of aspirin: Secondary | ICD-10-CM | POA: Insufficient documentation

## 2019-12-03 DIAGNOSIS — R61 Generalized hyperhidrosis: Secondary | ICD-10-CM | POA: Insufficient documentation

## 2019-12-03 DIAGNOSIS — Z9081 Acquired absence of spleen: Secondary | ICD-10-CM | POA: Insufficient documentation

## 2019-12-03 DIAGNOSIS — D72820 Lymphocytosis (symptomatic): Secondary | ICD-10-CM | POA: Diagnosis not present

## 2019-12-03 DIAGNOSIS — D75839 Thrombocytosis, unspecified: Secondary | ICD-10-CM

## 2019-12-03 DIAGNOSIS — R5383 Other fatigue: Secondary | ICD-10-CM | POA: Diagnosis not present

## 2019-12-03 LAB — CBC WITH DIFFERENTIAL (CANCER CENTER ONLY)
Abs Immature Granulocytes: 0.02 10*3/uL (ref 0.00–0.07)
Basophils Absolute: 0.2 10*3/uL — ABNORMAL HIGH (ref 0.0–0.1)
Basophils Relative: 1 %
Eosinophils Absolute: 0.7 10*3/uL — ABNORMAL HIGH (ref 0.0–0.5)
Eosinophils Relative: 7 %
HCT: 38.8 % (ref 36.0–46.0)
Hemoglobin: 13.4 g/dL (ref 12.0–15.0)
Immature Granulocytes: 0 %
Lymphocytes Relative: 53 %
Lymphs Abs: 5.7 10*3/uL — ABNORMAL HIGH (ref 0.7–4.0)
MCH: 33.8 pg (ref 26.0–34.0)
MCHC: 34.5 g/dL (ref 30.0–36.0)
MCV: 97.7 fL (ref 80.0–100.0)
Monocytes Absolute: 0.6 10*3/uL (ref 0.1–1.0)
Monocytes Relative: 6 %
Neutro Abs: 3.5 10*3/uL (ref 1.7–7.7)
Neutrophils Relative %: 33 %
Platelet Count: 486 10*3/uL — ABNORMAL HIGH (ref 150–400)
RBC: 3.97 MIL/uL (ref 3.87–5.11)
RDW: 14.7 % (ref 11.5–15.5)
WBC Count: 10.6 10*3/uL — ABNORMAL HIGH (ref 4.0–10.5)
nRBC: 0 % (ref 0.0–0.2)

## 2019-12-03 NOTE — Assessment & Plan Note (Signed)
Thrombocytosis: Review of the labs revealed platelet counts469 on 06/07/2016.Patient has had mild elevation of plateletsatleastfor the last 4 years (538 in 2014; 571 in 2016 and 490 six months ago) Platelet count 11/01/2018: 494 Platelet count has not changed much in the last 7 years.  Probable cause: Splenectomy 20 years ago I do not suspect any myeloproliferative neoplasm.  Severe fatigue, hypertension and headaches: Due to low blood pressure Given her other cardiac comorbidities including blood sugars, cholesterol etc. currently on aspirin 81 mg daily.  Return to clinic in 1 year for follow-up

## 2019-12-05 LAB — SURGICAL PATHOLOGY

## 2019-12-05 LAB — FLOW CYTOMETRY

## 2019-12-06 ENCOUNTER — Telehealth: Payer: Self-pay | Admitting: Hematology and Oncology

## 2019-12-06 NOTE — Telephone Encounter (Signed)
Flow cytometry did not reveal any monoclonal B-cell population.  Therefore CLL cannot be ruled out.  I suspect that the slight increase in the lymphocytes is related to her recent vaccination. Follow-up back in 1 year

## 2020-06-02 DIAGNOSIS — K589 Irritable bowel syndrome without diarrhea: Secondary | ICD-10-CM | POA: Insufficient documentation

## 2020-06-02 DIAGNOSIS — A6009 Herpesviral infection of other urogenital tract: Secondary | ICD-10-CM | POA: Insufficient documentation

## 2020-06-02 DIAGNOSIS — J302 Other seasonal allergic rhinitis: Secondary | ICD-10-CM | POA: Insufficient documentation

## 2020-06-02 DIAGNOSIS — R232 Flushing: Secondary | ICD-10-CM | POA: Insufficient documentation

## 2020-06-02 DIAGNOSIS — R7303 Prediabetes: Secondary | ICD-10-CM | POA: Insufficient documentation

## 2020-06-02 DIAGNOSIS — N951 Menopausal and female climacteric states: Secondary | ICD-10-CM | POA: Insufficient documentation

## 2020-06-02 DIAGNOSIS — N920 Excessive and frequent menstruation with regular cycle: Secondary | ICD-10-CM | POA: Insufficient documentation

## 2020-06-02 HISTORY — DX: Prediabetes: R73.03

## 2020-06-03 ENCOUNTER — Other Ambulatory Visit: Payer: Self-pay

## 2020-06-03 ENCOUNTER — Ambulatory Visit: Payer: 59 | Admitting: Cardiology

## 2020-06-03 ENCOUNTER — Encounter: Payer: Self-pay | Admitting: Cardiology

## 2020-06-03 VITALS — BP 104/62 | HR 75 | Resp 18 | Ht 63.0 in | Wt 159.1 lb

## 2020-06-03 DIAGNOSIS — E782 Mixed hyperlipidemia: Secondary | ICD-10-CM

## 2020-06-03 DIAGNOSIS — R0789 Other chest pain: Secondary | ICD-10-CM

## 2020-06-03 NOTE — Progress Notes (Signed)
Cardiology Office Note:    Date:  06/03/2020   ID:  Deborah [name], DOB 05-22-1965, MRN [id number]  PCP:  Shelda Pal, DO  Cardiologist:  Jenean Lindau, MD   Referring MD: Shelda Pal*    ASSESSMENT:    1. Chest discomfort   2. Mixed dyslipidemia    PLAN:    In order of problems listed above:  1. Primary prevention stressed with the patient.  Importance of compliance with diet medication stressed and she vocalized understanding.  Chest discomfort has resolved and she is happy about it.  However she leads a sedentary lifestyle.  Importance of regular exercise stressed.  I told her to walk at least half an hour a day 5 days a week and she promises to do so. 2. Mixed dyslipidemia: She is diet controlled diabetic and therefore she is on statin therapy and tolerating it well.  She has blood work done by her primary care doctor and tells me that she would like me the next time she gets blood work.  I reviewed her previous blood work and discussed it with her at length. 3. Patient will be seen in follow-up appointment in 9 months or earlier if the patient has any concerns    Medication Adjustments/Labs and Tests Ordered: Current medicines are reviewed at length with the patient today.  Concerns regarding medicines are outlined above.  Orders Placed This Encounter  Procedures  . EKG 12-Lead   No orders of the defined types were placed in this encounter.    Chief Complaint  Patient presents with  . Follow-up    9 month FU     History of Present Illness:    Deborah [name] is a 55 y.o. female.  Patient has past medical history of chest discomfort and she was evaluated for this reason.  Her stress test and echocardiogram was unremarkable.  Subsequently she denies any problems at this time.  No chest pain orthopnea or PND.  Overall she leads a sedentary lifestyle.  At the time of my evaluation, the patient is alert awake oriented and in no  distress.  Past Medical History:  Diagnosis Date  . Abdominal pain, generalized 11/28/2009   Qualifier: Diagnosis of  By: Trellis Paganini PA-c, Amy S   . Abdominal pain, left upper quadrant 05/29/2008   Qualifier: Diagnosis of  By: Nelson-Smith CMA (AAMA), Dottie    . ABDOMINAL PAIN-EPIGASTRIC 11/28/2009   Qualifier: Diagnosis of  By: Trellis Paganini PA-c, Amy S   . Abnormal CT scan, colon 12/31/2015  . Abnormal weight gain 11/28/2009   Qualifier: Diagnosis of  By: Trellis Paganini PA-c, Amy S   . Asplenia 08/27/2019  . B12 DEFICIENCY 12/20/2008   Qualifier: Diagnosis of  By: Marland Mcalpine    . Cardiac murmur 10/27/2018  . Chronic constipation   . Chronic pancreatitis ([place]) LOW-GRADE   FOLLOWED BY DR Sydell Axon BRODIE  . COLONIC POLYPS, HX OF 05/23/2008   Qualifier: Diagnosis of  By: Nelson-Smith CMA (AAMA), Dottie    . CONSTIPATION 11/28/2009   Qualifier: Diagnosis of  By: Trellis Paganini PA-c, Amy S   . GENITAL HERPES 05/11/2007   Qualifier: Diagnosis of  By: Mickle Asper, Ramond Craver    . Genital herpes 10/25/2018  . Genital herpes in women   . Hot flashes   . IBS (irritable bowel syndrome)   . Insomnia 08/27/2019  . Irritable bowel syndrome 05/11/2007   Qualifier: Diagnosis of  By: Arsenio Loader RN, Ramond Craver    . Menorrhagia   .  Mixed dyslipidemia 03/07/2019  . Nausea alone 11/28/2009   Qualifier: Diagnosis of  By: Trellis Paganini PA-c, Amy S   . PANCREATITIS 05/11/2007   Qualifier: Diagnosis of  By: Arsenio Loader RN, Ramond Craver    . Perimenopausal   . Plantar fasciitis, bilateral 08/27/2019  . Prediabetes 06/02/2020  . SCOLIOSIS 05/23/2008   Qualifier: Diagnosis of  By: Nelson-Smith CMA (AAMA), Dottie    . Seasonal allergies   . Thrombocytosis 07/13/2016  . Vitamin B12 deficiency 03/07/2019  . Vitamin D deficiency 03/07/2019    Past Surgical History:  Procedure Laterality Date  . ABLATION  2014  . APPENDECTOMY  5/21/197   Incidental appy  . KNEE ARTHROSCOPY  2004   RIGHT  . SPLENECTOMY, TOTAL  08/10/1995   For splwnic cyst - accessory  spleen also removed    Current Medications: No outpatient medications have been marked as taking for the 06/03/20 encounter (Office Visit) with Ulysees Robarts, Reita Cliche, MD.     Allergies:   Amoxicillin, Norgestimate-eth estradiol, Ivp dye [iodinated diagnostic agents], and Penicillins   Social History   Socioeconomic History  . Marital status: Single    Spouse name: Not on file  . Number of children: Not on file  . Years of education: Not on file  . Highest education level: Not on file  Occupational History  . Occupation: Licensed conveyancer  Tobacco Use  . Smoking status: Never Smoker  . Smokeless tobacco: Never Used  Substance and Sexual Activity  . Alcohol use: No  . Drug use: No  . Sexual activity: Not on file  Other Topics Concern  . Not on file  Social History Narrative  . Not on file   Social Determinants of Health   Financial Resource Strain: Not on file  Food Insecurity: Not on file  Transportation Needs: Not on file  Physical Activity: Not on file  Stress: Not on file  Social Connections: Not on file     Family History: The patient's family history includes CAD in her father; Cancer in her father, mother, and paternal grandmother; Depression in her father and sister; Diabetes in her paternal grandfather; Diverticulitis in her father; Hearing loss in her maternal grandmother; Ulcerative colitis in her mother.  ROS:   Please see the history of present illness.    All other systems reviewed and are negative.  EKGs/Labs/Other Studies Reviewed:    The following studies were reviewed today: I discussed my findings with the patient   Recent Labs: 08/27/2019: ALT 16; BUN 21; Creatinine, Ser 0.82; Potassium 4.1; Sodium 138 12/03/2019: Hemoglobin 13.4; Platelet Count 486  Recent Lipid Panel    Component Value Date/Time   CHOL 163 08/27/2019 0739   CHOL 169 03/07/2019 0937   TRIG 81.0 08/27/2019 0739   HDL 61.10 08/27/2019 0739   HDL 72 03/07/2019 0937   CHOLHDL 3  08/27/2019 0739   VLDL 16.2 08/27/2019 0739   LDLCALC 86 08/27/2019 0739   LDLCALC 86 03/07/2019 0937    Physical Exam:    VS:  BP 104/62 (BP Location: Left Arm, Patient Position: Sitting, Cuff Size: Normal)   Pulse 75   Resp 18   Ht 5\' 3"  (1.6 m)   Wt 159 lb 1.3 oz (72.2 kg)   BMI 28.18 kg/m     Wt Readings from Last 3 Encounters:  06/03/20 159 lb 1.3 oz (72.2 kg)  12/03/19 148 lb 8 oz (67.4 kg)  09/19/19 148 lb (67.1 kg)     GEN: Patient is in no acute distress  HEENT: Normal NECK: No JVD; No carotid bruits LYMPHATICS: No lymphadenopathy CARDIAC: Hear sounds regular, 2/6 systolic murmur at the apex. RESPIRATORY:  Clear to auscultation without rales, wheezing or rhonchi  ABDOMEN: Soft, non-tender, non-distended MUSCULOSKELETAL:  No edema; No deformity  SKIN: Warm and dry NEUROLOGIC:  Alert and oriented x 3 PSYCHIATRIC:  Normal affect   Signed, Jenean Lindau, MD  06/03/2020 5:14 PM    [facility]

## 2020-06-03 NOTE — Patient Instructions (Signed)
Medication Instructions:  No medication changes *If you need a refill on your cardiac medications before your next appointment, please call your pharmacy*   Lab Work: None ordered If you have labs (blood work) drawn today and your tests are completely normal, you will receive your results only by: . MyChart Message (if you have MyChart) OR . A paper copy in the mail If you have any lab test that is abnormal or we need to change your treatment, we will call you to review the results.   Testing/Procedures: None ordered   Follow-Up: At [facility], you and your health needs are our priority.  As part of our continuing mission to provide you with exceptional heart care, we have created designated Provider Care Teams.  These Care Teams include your primary Cardiologist (physician) and Advanced Practice Providers (APPs -  Physician Assistants and Nurse Practitioners) who all work together to provide you with the care you need, when you need it.  We recommend signing up for the patient portal called "MyChart".  Sign up information is provided on this After Visit Summary.  MyChart is used to connect with patients for Virtual Visits (Telemedicine).  Patients are able to view lab/test results, encounter notes, upcoming appointments, etc.  Non-urgent messages can be sent to your provider as well.   To learn more about what you can do with MyChart, go to [email or web address].    Your next appointment:   9 month(s)  The format for your next appointment:   In Person  Provider:   Rajan Revankar, MD   Other Instructions NA 

## 2020-06-03 NOTE — Progress Notes (Signed)
Medication Instructions:  No medication changes. *If you need a refill on your cardiac medications before your next appointment, please call your pharmacy*   Lab Work: None ordered If you have labs (blood work) drawn today and your tests are completely normal, you will receive your results only by: Marland Kitchen MyChart Message (if you have MyChart) OR . A paper copy in the mail If you have any lab test that is abnormal or we need to change your treatment, we will call you to review the results.   Testing/Procedures: None ordered   Follow-Up: At [facility], you and your health needs are our priority.  As part of our continuing mission to provide you with exceptional heart care, we have created designated Provider Care Teams.  These Care Teams include your primary Cardiologist (physician) and Advanced Practice Providers (APPs -  Physician Assistants and Nurse Practitioners) who all work together to provide you with the care you need, when you need it.  We recommend signing up for the patient portal called "MyChart".  Sign up information is provided on this After Visit Summary.  MyChart is used to connect with patients for Virtual Visits (Telemedicine).  Patients are able to view lab/test results, encounter notes, upcoming appointments, etc.  Non-urgent messages can be sent to your provider as well.   To learn more about what you can do with MyChart, go to [email or web address].    Your next appointment:   2 month(s)  The format for your next appointment:   In Person  Provider:   Jyl Heinz, MD   Other Instructions none

## 2020-06-13 ENCOUNTER — Other Ambulatory Visit: Payer: Self-pay | Admitting: Cardiology

## 2020-06-16 NOTE — Telephone Encounter (Signed)
Refill sent to pharmacy.   

## 2020-10-23 ENCOUNTER — Other Ambulatory Visit: Payer: Self-pay

## 2020-10-23 ENCOUNTER — Encounter: Payer: Self-pay | Admitting: Neurology

## 2020-10-23 ENCOUNTER — Ambulatory Visit: Payer: 59 | Admitting: Neurology

## 2020-10-23 VITALS — BP 113/74 | HR 64 | Ht 63.0 in | Wt 166.4 lb

## 2020-10-23 DIAGNOSIS — M542 Cervicalgia: Secondary | ICD-10-CM

## 2020-10-23 NOTE — Progress Notes (Signed)
Subjective:    Patient ID: Deborah [name] is a 55 y.o. female.  HPI    Interim history:   Deborah [name] is a 55 year old right-handed woman with an underlying medical seasonal allergies, history of pancreatitis, irritable bowel syndrome, B12 deficiency, thrombocytosis, reflux disease, vitamin D deficiency, depression, and anxiety, who presents for reevaluation of her headaches.  The patient is unaccompanied today and presents after nearly 2 years.  I had seen her in August 2020 for her recurrent headaches.  She also reported hand numbness for about 3 months and chronic neck pain.  Her headaches had actually improved, she had a benign neurological exam.  She was supposed to see hematology for abnormalities in her cell count.  She had elevated white cell count and platelets.  She was advised to proceed with further testing through our office in the form of neck MRI as well as EMG and nerve conduction velocity testing.  She had a cervical spine MRI without contrast on 11/22/2018 and I reviewed the results:  IMPRESSION:   MRI cervical spine (without) demonstrating: - At C4-5: disc bulging with mild spinal stenosis and moderate left foraminal stenosis. She was notified of the test results.   Her EMG and nerve conduction velocity test from 01/01/2019 showed: IMPRESSION:   Nerve conduction studies done on both upper extremities and on the right lower extremity were unremarkable, no evidence of a neuropathy is seen.  EMG of the right upper extremity is unremarkable, no evidence of a cervical radiculopathy was seen.   She was notified of the test results and advised to consider seeing a spine specialist.  She was advised to talk to her primary care about a referral.  Today, 10/23/2020: She reports she reports ongoing issues with chronic neck pain.  It is soft and bothersome at night.  She does not have much in the way of radiation to one side or the other.  She denies any sudden onset of one-sided  weakness or numbness or tingling or droopy face or slurring of speech.  She denies recurrent headaches.  Neck discomfort is not alleviated or exacerbated by certain activities.  She is in the process of transferring care to a new primary care physician as her previous primary care physician has gone part-time.  She has not had a chance to pursue a referral to a spine specialist.  She has not been sleeping very well and wants to talk to her primary care about trying Ambien.  She is currently on meloxicam as needed.  Denies any recent injuries or falls.  She reports that she had recent stressors and did not focus on getting a follow-up appointment with her primary care to talk about a spine specialist referral.  Intermittent numbness and tingling in both upper and lower extremities.  Right knee is painful, she had an injection about 2 weeks ago.  She has had prior steroid injections, no gel injections.  She sees Dr. Mayer Camel for this.  The patient's allergies, current medications, family history, past medical history, past social history, past surgical history and problem list were reviewed and updated as appropriate.   Previously:   11/15/18: (She) reports a history of severe headaches in the past, more recently, she had some recurrence of headaches.  She is more worried about her hand numbness which has been going on for the past 3 months.  It is an intermittent numbness in both hands, typically starting at the wrist, right more than left.  Symptoms are worse at night,  and it helps to shake her hands when she wakes up with numbness, no severe pain in no significant tingling noted, some symptoms at times in her feet but not consistently.  She has had no recent changes in her weight.  She does not have any telltale radiating pain.  She has had neck pain for the past 3+ months.  She has tried to use a different pillow which has not really made a big difference.  She feels that her headaches have recently improved.   She does not have any significant snoring and denies any significant daytime somnolence.  Her Epworth sleepiness score is 4 out of 24, fatigue severity score is 41 out of 63.  She lives with her partner and partner's mother.  She is familiar with the diagnosis of obstructive sleep apnea as her significant other has sleep apnea and uses a CPAP machine.  She has had no recent increase in repetitive movements.  She does type at the computer.  She works as a Licensed conveyancer.  She does not have any recent increase in stress or anxiety.  She does have a history of anxiety and depression and is currently not on any medication.  She tried Wellbutrin in the past and developed a rash.  She has occasional morning headaches.  Her headaches actually have improved since she has been hydrating a little better.  She saw cardiology recently and had work-up in the form of stress testing and echocardiogram.  She had blood work in July in your office which showed a elevated B12, I reviewed the office note from 10/17/2018.  She has had morning headaches.  She had blood work through your office on 10/17/2018 and I reviewed the results: CBC with differential showed WBC of 12.2, RBC of 4.1, platelets of 495 which is mildly elevated, she also had a vitamin D level check but that result was not visible to me. She has previously seen Dr. Orie Rout for headache management.  She had a brain MRI with and without contrast on 07/20/2010 and I reviewed the results: IMPRESSION: Normal study of the brain.   Chronic sinusitis.   Her Past Medical History Is Significant For: Past Medical History:  Diagnosis Date   Abdominal pain, generalized 11/28/2009   Qualifier: Diagnosis of  By: Trellis Paganini PA-c, Amy S    Abdominal pain, left upper quadrant 05/29/2008   Qualifier: Diagnosis of  By: Nelson-Smith CMA (AAMA), Dottie     ABDOMINAL PAIN-EPIGASTRIC 11/28/2009   Qualifier: Diagnosis of  By: Trellis Paganini PA-c, Amy S    Abnormal CT scan, colon  12/31/2015   Abnormal weight gain 11/28/2009   Qualifier: Diagnosis of  By: Trellis Paganini PA-c, Amy S    Asplenia 08/27/2019   B12 DEFICIENCY 12/20/2008   Qualifier: Diagnosis of  By: Marland Mcalpine     Cardiac murmur 10/27/2018   Chronic constipation    Chronic pancreatitis ([place]) LOW-GRADE   FOLLOWED BY DR Sydell Axon BRODIE   COLONIC POLYPS, HX OF 05/23/2008   Qualifier: Diagnosis of  By: Nelson-Smith CMA (AAMA), Dottie     CONSTIPATION 11/28/2009   Qualifier: Diagnosis of  By: Shane Crutch, Amy S    GENITAL HERPES 05/11/2007   Qualifier: Diagnosis of  By: Arsenio Loader RN, Carissa     Genital herpes 10/25/2018   Genital herpes in women    Hot flashes    IBS (irritable bowel syndrome)    Insomnia 08/27/2019   Irritable bowel syndrome 05/11/2007   Qualifier: Diagnosis of  By: Arsenio Loader  RN, Carissa     Menorrhagia    Mixed dyslipidemia 03/07/2019   Nausea alone 11/28/2009   Qualifier: Diagnosis of  By: Trellis Paganini PA-c, Amy S    PANCREATITIS 05/11/2007   Qualifier: Diagnosis of  By: Arsenio Loader RN, Carissa     Perimenopausal    Plantar fasciitis, bilateral 08/27/2019   Prediabetes 06/02/2020   Right knee pain    swelling and cortizone shot given and melxicam   SCOLIOSIS 05/23/2008   Qualifier: Diagnosis of  By: Nelson-Smith CMA (AAMA), Dottie     Seasonal allergies    Thrombocytosis 07/13/2016   Vitamin B12 deficiency 03/07/2019   Vitamin D deficiency 03/07/2019    Her Past Surgical History Is Significant For: Past Surgical History:  Procedure Laterality Date   ABLATION  2014   APPENDECTOMY  5/21/197   Incidental appy   KNEE ARTHROSCOPY  2004   RIGHT   SPLENECTOMY, TOTAL  08/10/1995   For splwnic cyst - accessory spleen also removed    Her Family History Is Significant For: Family History  Problem Relation Age of Onset   Ulcerative colitis Mother    Cancer Mother        waldenstroms   Cancer Father        prostate cancer   CAD Father    Diverticulitis Father    Depression  Father    Depression Sister    Thyroid nodules Sister    Hearing loss Maternal Grandmother    Cancer Paternal Grandmother        Ovarian cancer   Diabetes Paternal Grandfather     Her Social History Is Significant For: Social History   Socioeconomic History   Marital status: Single    Spouse name: Not on file   Number of children: 2   Years of education: Not on file   Highest education level: Not on file  Occupational History   Occupation: Licensed conveyancer  Tobacco Use   Smoking status: Never   Smokeless tobacco: Never  Vaping Use   Vaping Use: Never used  Substance and Sexual Activity   Alcohol use: No   Drug use: No   Sexual activity: Not on file  Other Topics Concern   Not on file  Social History Narrative   Not on file   Social Determinants of Health   Financial Resource Strain: Not on file  Food Insecurity: Not on file  Transportation Needs: Not on file  Physical Activity: Not on file  Stress: Not on file  Social Connections: Not on file    Her Allergies Are:  Allergies  Allergen Reactions   Amoxicillin Hives   Norgestimate-Eth Estradiol Hives   Ivp Dye [Iodinated Diagnostic Agents] Swelling    Eyes swell   Penicillins Hives  :   Her Current Medications Are:  Outpatient Encounter Medications as of 10/23/2020  Medication Sig   aspirin EC 81 MG tablet Take 1 tablet (81 mg total) by mouth daily.   atorvastatin (LIPITOR) 10 MG tablet TAKE 1 TABLET(10 MG) BY MOUTH DAILY   Cholecalciferol (VITAMIN D3) 1000 units CAPS Take 3,000 Units by mouth 3 (three) times a week.   Docusate Calcium (STOOL SOFTENER PO) Take 1 tablet by mouth daily.    fluticasone (FLONASE) 50 MCG/ACT nasal spray Place 2 sprays into the nose daily.   levocetirizine (XYZAL) 5 MG tablet Take 5 mg by mouth every evening.   meloxicam (MOBIC) 15 MG tablet Take 15 mg by mouth daily.   montelukast (SINGULAIR) 10 MG tablet Take  10 mg by mouth at bedtime.   Probiotic Product (ALIGN PO) Take 1 tablet by  mouth daily.   valACYclovir (VALTREX) 500 MG tablet Take 500 mg by mouth daily.   venlafaxine XR (EFFEXOR-XR) 75 MG 24 hr capsule Take 1 capsule (75 mg total) by mouth daily with breakfast.   vitamin B-12 (CYANOCOBALAMIN) 1000 MCG tablet Take 1,000 mcg by mouth 3 (three) times a week.    No facility-administered encounter medications on file as of 10/23/2020.  :  Review of Systems:  Out of a complete 14 point review of systems, all are reviewed and negative with the exception of these symptoms as listed below:  Review of Systems  Neurological:        Pt complains of neck pain. Pt stated no relief and worsens at night    Objective:  Neurological Exam  Physical Exam Physical Examination:   Vitals:   10/23/20 0733  BP: 113/74  Pulse: 64    General Examination: The patient is a very pleasant 55 y.o. female in no acute distress. She appears well-developed and well-nourished and well groomed.   HEENT: Normocephalic, atraumatic, pupils are equal, round and reactive to light, extraocular tracking is well-preserved, corrective eyeglasses in place.  Face is symmetric with normal facial animation and normal facial sensation to light touch, temperature and vibration.  Speech is clear without dysarthria, hypophonia or voice tremor.  No carotid bruits.  Airway examination reveals stable findings.  Tongue protrudes centrally and palate elevates symmetrically.  Neck is supple with full range of active and passive movements.  No lip, neck or jaw tremor.   Chest: Clear to auscultation without wheezing, rhonchi or crackles noted.   Heart: S1+S2+0, regular and normal without murmurs, rubs or gallops noted.   Abdomen: Soft, non-tender and non-distended with normal bowel sounds appreciated on auscultation.   Extremities: There is no pitting edema in the distal lower extremities bilaterally.   Skin: Warm and dry without trophic changes noted.   Musculoskeletal: exam reveals no obvious joint  deformities, mild right knee swelling, has mild right knee discomfort, recent injection with steroids about 2 weeks ago per her report.   Neurologically: Mental status: The patient is awake, alert and oriented in all 4 spheres. Her immediate and remote memory, attention, language skills and fund of knowledge are appropriate. There is no evidence of aphasia, agnosia, apraxia or anomia. Speech is clear with normal prosody and enunciation. Thought process is linear. Mood is normal and affect is normal. Cranial nerves II - XII are as described above under HEENT exam.  Motor exam: Normal bulk, strength and tone is noted. There is no drift, resting tremor or rebound. She has a slight upper extremity postural and action tremor (not new), Romberg is negative. Reflexes are 1+ throughout. Babinski: Toes are flexor bilaterally. Fine motor skills and coordination: intact with normal finger taps, normal hand movements, normal rapid alternating patting, normal foot taps and normal foot agility.  Cerebellar testing: No dysmetria or intention tremor on finger to nose testing. Heel to shin is unremarkable bilaterally. There is no truncal or gait ataxia. Sensory exam: intact to light touch, vibration, temperature sense in the upper and lower extremities. Gait, station and balance: She stands easily. No veering to one side is noted. No leaning to one side is noted. Posture is age-appropriate and stance is narrow based. Gait shows normal stride length and normal pace. No problems turning are noted. Tandem walk is unremarkable.   Assessment and Plan:  In summary, Deborah [name] is a very pleasant 55 year old female with an underlying medical seasonal allergies, history of pancreatitis, irritable bowel syndrome, B12 deficiency, thrombocytosis, reflux disease, vitamin D deficiency, depression, knee arthritis, chronic neck pain, and anxiety, who presents for follow-up consultation of her chronic neck pain.  She has not  had any sudden exacerbation.  Prior cervical spine MRI showed mild degenerative changes, mild cervical spinal stenosis.  She had been advised to talk to her primary care physician about a referral to a spine specialist through orthopedics or neurosurgery.  I reiterated this recommendation today.  She can take the Mobic as needed.  She has this for her knee pain.  She can also run this by her current orthopedic doctor that she sees for her knee pain.  He may have additional recommendations or recommendations regarding one of his colleagues that she could see.  She is in the process of establishing care with a new primary care and is advised to talk to her about this as well.  For now, I suggested she use an microwavable heat pad in the neck area where it is uncomfortable.  She is advised to continue to take melatonin for sleep which can help.  She is going to talk to her primary care physician about her sleep difficulty and wants to try Ambien.  She is advised to try Voltaren gel locally.  Exam is stable and benign.  Prior work-up included a cervical spine MRI in 2020 as well as EMG and nerve conduction velocity testing in 2020 which showed benign findings.  She is largely reassured today.  She reports ongoing pain in the right knee and below.  She is wondering if she could have a blood clot.  Findings are not suggestive of any focal swelling, no palpable cord or lump, only mild swelling in the medial knee joint area noted, no redness, no prominent veins or varicosities.  She is advised to talk to her primary care physician about this as well.  I do not see any red flags or urgent concerns.  She has an appointment with her new PCP coming up in a week and a half or so.  We can see her in this clinic on an as-needed basis.  I answered all her questions today and she was in agreement.  I spent 30 minutes in total face-to-face time and in reviewing records during pre-charting, more than 50% of which was spent in  counseling and coordination of care, reviewing test results, reviewing medications and treatment regimen and/or in discussing or reviewing the diagnosis of neck pain, the prognosis and treatment options. Pertinent laboratory and imaging test results that were available during this visit with the patient were reviewed by me and considered in my medical decision making (see chart for details).

## 2020-10-23 NOTE — Patient Instructions (Addendum)
It was nice to see you again today.  For your chronic neck pain, I recommend you establish care or consult with a spine specialist through orthopedics or neurosurgery.  Please discuss your referral with your new primary care physician.  In the meantime, you can try Voltaren gel locally in the area that is painful.  Your symptoms do not suggest a sudden disc herniation or severe pinched nerve type changes but degenerative changes that were seen in your MRI from 2020 he may have become worse over time.  You can also use a microwavable heat pad in the neck.  Do not use it for prolonged periods of time and do not use an electric blanket for safety reasons.  You can also discuss this with your current orthopedic doctor who you see for your knee.  He may have further suggestions or a colleague he may recommend.  Please continue with regular strengthening exercises and range of motion exercises, as you have.  We can see you back in this clinic on an as-needed basis, your exam is stable and benign thankfully.

## 2020-11-29 NOTE — Progress Notes (Signed)
Patient Care Team: Nickola Major, MD as PCP - General (Family Medicine)  DIAGNOSIS:    ICD-10-CM   1. Thrombocytosis  D75.839       CHIEF COMPLIANT:  Follow-up of thrombocytosis  INTERVAL HISTORY: Deborah [name] is a 55 y.o. with above-mentioned history of thrombocytosis. She presents to the clinic today for follow-up.  She complains of arthritis in her right knee as well as swelling of the right leg.  She had received an injection to her knee but it is not getting any better.  ALLERGIES:  is allergic to amoxicillin, norgestimate-eth estradiol, ivp dye [iodinated diagnostic agents], and penicillins.  MEDICATIONS:  Current Outpatient Medications  Medication Sig Dispense Refill   aspirin EC 81 MG tablet Take 1 tablet (81 mg total) by mouth daily.     atorvastatin (LIPITOR) 10 MG tablet TAKE 1 TABLET(10 MG) BY MOUTH DAILY 90 tablet 2   Cholecalciferol (VITAMIN D3) 1000 units CAPS Take 3,000 Units by mouth 3 (three) times a week.     Docusate Calcium (STOOL SOFTENER PO) Take 1 tablet by mouth daily.      fluticasone (FLONASE) 50 MCG/ACT nasal spray Place 2 sprays into the nose daily.     levocetirizine (XYZAL) 5 MG tablet Take 5 mg by mouth every evening.     meloxicam (MOBIC) 15 MG tablet Take 15 mg by mouth daily.     montelukast (SINGULAIR) 10 MG tablet Take 10 mg by mouth at bedtime.     Probiotic Product (ALIGN PO) Take 1 tablet by mouth daily.     valACYclovir (VALTREX) 500 MG tablet Take 500 mg by mouth daily.     venlafaxine XR (EFFEXOR-XR) 75 MG 24 hr capsule Take 1 capsule (75 mg total) by mouth daily with breakfast.     vitamin B-12 (CYANOCOBALAMIN) 1000 MCG tablet Take 1,000 mcg by mouth 3 (three) times a week.      No current facility-administered medications for this visit.    PHYSICAL EXAMINATION: ECOG PERFORMANCE STATUS: 1 - Symptomatic but completely ambulatory  Vitals:   12/01/20 0810  BP: 107/72  Pulse: 72  Resp: 16  Temp: (!) 97.3 F (36.3 C)   SpO2: 95%   Filed Weights   12/01/20 0810  Weight: 163 lb 11.2 oz (74.3 kg)     LABORATORY DATA:  I have reviewed the data as listed CMP Latest Ref Rng & Units 08/27/2019 03/07/2019 11/01/2018  Glucose 70 - 99 mg/dL 95 92 88  BUN 6 - 23 mg/dL '21 15 14  '$ Creatinine 0.40 - 1.20 mg/dL 0.82 0.73 0.77  Sodium 135 - 145 mEq/L 138 138 137  Potassium 3.5 - 5.1 mEq/L 4.1 4.4 4.2  Chloride 96 - 112 mEq/L 104 102 100  CO2 19 - 32 mEq/L '28 22 23  '$ Calcium 8.4 - 10.5 mg/dL 9.3 9.7 9.6  Total Protein 6.0 - 8.3 g/dL 6.5 6.7 6.6  Total Bilirubin 0.2 - 1.2 mg/dL 0.4 0.4 0.4  Alkaline Phos 39 - 117 U/L 72 82 90  AST 0 - 37 U/L '16 22 24  '$ ALT 0 - 35 U/L '16 23 23    '$ Lab Results  Component Value Date   WBC 10.8 (H) 12/01/2020   HGB 14.0 12/01/2020   HCT 40.3 12/01/2020   MCV 96.2 12/01/2020   PLT 551 (H) 12/01/2020   NEUTROABS 3.6 12/01/2020    ASSESSMENT & PLAN:  Thrombocytosis (HCC) Thrombocytosis: Review of the labs revealed platelet counts 469 on 06/07/2016. Patient has  had mild elevation of platelets atleast for the last 4 years (538 in 2014; 571 in 2016 and 490 six months ago) Platelet count 11/01/2018: 494 Platelet count 12/03/2019: 486   Platelet count has not changed much in the last 7 years.   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  CD4 negative CD8 negative inflammatory T cells noted Current treatment: Aspirin 81 mg daily I suspect that the cause of the elevated lymphocytes his information related to her knee. However given the fact that she has swelling of the leg I will order an ultrasound of the right leg. We will discuss results with her by phone. I encouraged her to take turmeric for 100 mg capsule every day to reduce inflammation.  Return to clinic in 1 year for follow-up    No orders of the defined types were placed in this encounter.  The patient has a good understanding of the overall plan. she agrees  with it. she will call with any problems that may develop before the next visit here.  Total time spent: 20 mins including face to face time and time spent for planning, charting and coordination of care  Rulon Eisenmenger, MD, MPH 12/01/2020  I, Thana Ates, am acting as scribe for Dr. Nicholas Lose.  I have reviewed the above documentation for accuracy and completeness, and I agree with the above.

## 2020-12-01 ENCOUNTER — Telehealth: Payer: Self-pay | Admitting: *Deleted

## 2020-12-01 ENCOUNTER — Ambulatory Visit (HOSPITAL_COMMUNITY)
Admission: RE | Admit: 2020-12-01 | Discharge: 2020-12-01 | Disposition: A | Discharge status: Home / Self Care | Payer: 59 | Source: Ambulatory Visit | Attending: Cardiology | Admitting: Cardiology

## 2020-12-01 ENCOUNTER — Inpatient Hospital Stay: Payer: 59

## 2020-12-01 ENCOUNTER — Other Ambulatory Visit: Payer: Self-pay | Admitting: *Deleted

## 2020-12-01 ENCOUNTER — Other Ambulatory Visit: Payer: Self-pay

## 2020-12-01 ENCOUNTER — Inpatient Hospital Stay: Payer: 59 | Attending: Hematology and Oncology | Admitting: Hematology and Oncology

## 2020-12-01 DIAGNOSIS — D75839 Thrombocytosis, unspecified: Secondary | ICD-10-CM

## 2020-12-01 DIAGNOSIS — Z7982 Long term (current) use of aspirin: Secondary | ICD-10-CM | POA: Diagnosis not present

## 2020-12-01 DIAGNOSIS — Z79899 Other long term (current) drug therapy: Secondary | ICD-10-CM | POA: Insufficient documentation

## 2020-12-01 DIAGNOSIS — M7989 Other specified soft tissue disorders: Secondary | ICD-10-CM | POA: Diagnosis not present

## 2020-12-01 LAB — CBC WITH DIFFERENTIAL (CANCER CENTER ONLY)
Abs Immature Granulocytes: 0.02 10*3/uL (ref 0.00–0.07)
Basophils Absolute: 0.1 10*3/uL (ref 0.0–0.1)
Basophils Relative: 1 %
Eosinophils Absolute: 0.5 10*3/uL (ref 0.0–0.5)
Eosinophils Relative: 5 %
HCT: 40.3 % (ref 36.0–46.0)
Hemoglobin: 14 g/dL (ref 12.0–15.0)
Immature Granulocytes: 0 %
Lymphocytes Relative: 56 %
Lymphs Abs: 6 10*3/uL — ABNORMAL HIGH (ref 0.7–4.0)
MCH: 33.4 pg (ref 26.0–34.0)
MCHC: 34.7 g/dL (ref 30.0–36.0)
MCV: 96.2 fL (ref 80.0–100.0)
Monocytes Absolute: 0.5 10*3/uL (ref 0.1–1.0)
Monocytes Relative: 5 %
Neutro Abs: 3.6 10*3/uL (ref 1.7–7.7)
Neutrophils Relative %: 33 %
Platelet Count: 551 10*3/uL — ABNORMAL HIGH (ref 150–400)
RBC: 4.19 MIL/uL (ref 3.87–5.11)
RDW: 14 % (ref 11.5–15.5)
WBC Count: 10.8 10*3/uL — ABNORMAL HIGH (ref 4.0–10.5)
nRBC: 0 % (ref 0.0–0.2)

## 2020-12-01 LAB — CMP (CANCER CENTER ONLY)
ALT: 35 U/L (ref 0–44)
AST: 27 U/L (ref 15–41)
Albumin: 4 g/dL (ref 3.5–5.0)
Alkaline Phosphatase: 96 U/L (ref 38–126)
Anion gap: 11 (ref 5–15)
BUN: 21 mg/dL — ABNORMAL HIGH (ref 6–20)
CO2: 23 mmol/L (ref 22–32)
Calcium: 9.8 mg/dL (ref 8.9–10.3)
Chloride: 105 mmol/L (ref 98–111)
Creatinine: 0.91 mg/dL (ref 0.44–1.00)
GFR, Estimated: >60 mL/min (ref >60)
Glucose, Bld: 109 mg/dL — ABNORMAL HIGH (ref 70–99)
Potassium: 4.1 mmol/L (ref 3.5–5.1)
Sodium: 139 mmol/L (ref 135–145)
Total Bilirubin: 0.3 mg/dL (ref 0.3–1.2)
Total Protein: 7.5 g/dL (ref 6.5–8.1)

## 2020-12-01 LAB — LACTATE DEHYDROGENASE: LDH: 183 U/L (ref 98–192)

## 2020-12-01 NOTE — Assessment & Plan Note (Signed)
Thrombocytosis: Review of the labs revealed platelet counts469 on 06/07/2016.Patient has had mild elevation of plateletsatleastfor the last 4 years (538 in 2014; 571 in 2016 and 490 six months ago) Platelet count 11/01/2018: 494 Platelet count 12/03/2019: 486  Platelet count has not changed much in the last 7 years.  Probable cause: Splenectomy 20 years ago  Lymphocytosis: Flow cytometry: 2021: No evidence of CLL.  CD4 negative CD8 negative inflammatory T cells noted Severe fatigue and night sweats Current treatment: Aspirin 81 mg daily  Return to clinic in 1 year for follow-up

## 2020-12-01 NOTE — Telephone Encounter (Signed)
Per MD request RN placed order and scheduled appt for right lower extremity US to R/O DVT.  RN attempt x1 to contact pt with appt details.  No answer, LVM for pt to return call to the office.

## 2020-12-02 ENCOUNTER — Telehealth: Payer: Self-pay | Admitting: Hematology and Oncology

## 2020-12-02 ENCOUNTER — Ambulatory Visit (HOSPITAL_COMMUNITY): Payer: 59

## 2020-12-02 DIAGNOSIS — J309 Allergic rhinitis, unspecified: Secondary | ICD-10-CM | POA: Insufficient documentation

## 2020-12-02 HISTORY — DX: Allergic rhinitis, unspecified: J30.9

## 2020-12-02 NOTE — Telephone Encounter (Signed)
I left a voicemail that the lower extremity ultrasound was negative for blood clots. I recommended the elastic compression stockings to provide support to the legs.

## 2021-03-08 ENCOUNTER — Other Ambulatory Visit: Payer: Self-pay | Admitting: Cardiology

## 2021-03-25 ENCOUNTER — Other Ambulatory Visit: Payer: Self-pay

## 2021-03-25 DIAGNOSIS — J301 Allergic rhinitis due to pollen: Secondary | ICD-10-CM

## 2021-03-25 DIAGNOSIS — R059 Cough, unspecified: Secondary | ICD-10-CM

## 2021-03-25 DIAGNOSIS — J3081 Allergic rhinitis due to animal (cat) (dog) hair and dander: Secondary | ICD-10-CM | POA: Insufficient documentation

## 2021-03-25 DIAGNOSIS — H1045 Other chronic allergic conjunctivitis: Secondary | ICD-10-CM

## 2021-03-25 HISTORY — DX: Cough, unspecified: R05.9

## 2021-03-25 HISTORY — DX: Allergic rhinitis due to animal (cat) (dog) hair and dander: J30.81

## 2021-03-25 HISTORY — DX: Other chronic allergic conjunctivitis: H10.45

## 2021-03-25 HISTORY — DX: Allergic rhinitis due to pollen: J30.1

## 2021-04-01 ENCOUNTER — Other Ambulatory Visit: Payer: Self-pay

## 2021-04-01 ENCOUNTER — Ambulatory Visit: Payer: 59 | Admitting: Cardiology

## 2021-04-01 ENCOUNTER — Encounter: Payer: Self-pay | Admitting: Cardiology

## 2021-04-01 VITALS — BP 106/60 | HR 70 | Ht 63.0 in | Wt 166.0 lb

## 2021-04-01 DIAGNOSIS — E782 Mixed hyperlipidemia: Secondary | ICD-10-CM | POA: Diagnosis not present

## 2021-04-01 NOTE — Progress Notes (Signed)
Cardiology Office Note:    Date:  04/01/2021   ID:  Deborah [name], DOB 1965/07/18, MRN [id number]  PCP:  Nickola Major, MD  Cardiologist:  Jenean Lindau, MD   Referring MD: Nickola Major, MD    ASSESSMENT:    1. Mixed dyslipidemia    PLAN:    In order of problems listed above:  Primary prevention stressed with the patient.  Importance of compliance with diet medication stressed and she vocalized understanding.  She was advised to walk at least half an hour a day 5 days a week. Mixed dyslipidemia: Lipids were reviewed and discussed with her.  She will be back in the next few days for lipid check.  She will continue statin therapy at low-dose.  We will see her in follow-up on an annual basis.   Medication Adjustments/Labs and Tests Ordered: Current medicines are reviewed at length with the patient today.  Concerns regarding medicines are outlined above.  No orders of the defined types were placed in this encounter.  No orders of the defined types were placed in this encounter.    No chief complaint on file.    History of Present Illness:    Deborah [name] is a 56 y.o. female.  Patient has past medical history of mixed dyslipidemia.  She denies any problems at this time and takes care of activities of daily living.  No chest pain orthopnea or PND.  At the time of my evaluation, the patient is alert awake oriented and in no distress.  She tells me that she was walking regularly but because of orthopedic issues has slowed down.  Plans to begin walking again.  She had a 3 mile hike this morning and did well.  Past Medical History:  Diagnosis Date   Abdominal pain, generalized 11/28/2009   Qualifier: Diagnosis of  By: Trellis Paganini PA-c, Amy S    Abdominal pain, left upper quadrant 05/29/2008   Qualifier: Diagnosis of  By: Nelson-Smith CMA (AAMA), Dottie     ABDOMINAL PAIN-EPIGASTRIC 11/28/2009   Qualifier: Diagnosis of  By: Trellis Paganini PA-c, Amy S    Abnormal  CT scan, colon 12/31/2015   Abnormal weight gain 11/28/2009   Qualifier: Diagnosis of  By: Trellis Paganini PA-c, Amy S    Allergic rhinitis 12/02/2020   Allergic rhinitis due to animal (cat) (dog) hair and dander 03/25/2021   Allergic rhinitis due to pollen 03/25/2021   Asplenia 08/27/2019   Cardiac murmur 10/27/2018   Chronic allergic conjunctivitis 03/25/2021   Chronic constipation    Chronic pancreatitis ([place]) LOW-GRADE   FOLLOWED BY DR DORA BRODIE   COLONIC POLYPS, HX OF 05/23/2008   Qualifier: Diagnosis of  By: Nelson-Smith CMA (AAMA), Dottie     Cough 03/25/2021   Genital herpes 10/25/2018   Genital herpes in women    Hot flashes    IBS (irritable bowel syndrome)    Insomnia 08/27/2019   Irritable bowel syndrome 05/11/2007   Qualifier: Diagnosis of  By: Arsenio Loader RN, Carissa     Menorrhagia    Mixed dyslipidemia 03/07/2019   PANCREATITIS 05/11/2007   Qualifier: Diagnosis of  By: Arsenio Loader RN, Carissa     Perimenopausal    Plantar fasciitis, bilateral 08/27/2019   Prediabetes 06/02/2020   Right knee pain    swelling and cortizone shot given and melxicam   SCOLIOSIS 05/23/2008   Qualifier: Diagnosis of  By: Nelson-Smith CMA (AAMA), Dottie     Seasonal allergies    Thrombocytosis 07/13/2016  Vitamin B12 deficiency 03/07/2019   Vitamin D deficiency 03/07/2019    Past Surgical History:  Procedure Laterality Date   ABLATION  2014   APPENDECTOMY  5/21/197   Incidental appy   KNEE ARTHROSCOPY  2004   RIGHT   SPLENECTOMY, TOTAL  08/10/1995   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   atorvastatin (LIPITOR) 10 MG tablet TAKE 1 TABLET(10 MG) BY MOUTH DAILY   Cholecalciferol (VITAMIN D3) 1000 units CAPS Take 3,000 Units by mouth 3 (three) times a week.   Docusate Calcium (STOOL SOFTENER PO) Take 1 tablet by mouth daily.    fluticasone (FLONASE) 50 MCG/ACT  nasal spray Place 2 sprays into the nose daily.   levocetirizine (XYZAL) 5 MG tablet Take 5 mg by mouth every evening.   montelukast (SINGULAIR) 10 MG tablet Take 10 mg by mouth at bedtime.   Probiotic Product (ALIGN PO) Take 1 tablet by mouth daily.   valACYclovir (VALTREX) 500 MG tablet Take 500 mg by mouth daily.   vitamin B-12 (CYANOCOBALAMIN) 1000 MCG tablet Take 1,000 mcg by mouth 3 (three) times a week.    zolpidem (AMBIEN) 5 MG tablet Take 5 mg by mouth at bedtime as needed for sleep.     Allergies:   Amoxicillin, Norgestimate-eth estradiol, Ivp dye [iodinated contrast media], Penicillins, and Meloxicam   Social History   Socioeconomic History   Marital status: Single    Spouse name: Not on file   Number of children: 2   Years of education: Not on file   Highest education level: Not on file  Occupational History   Occupation: librarian  Tobacco Use   Smoking status: Never   Smokeless tobacco: Never  Vaping Use   Vaping Use: Never used  Substance and Sexual Activity   Alcohol use: No   Drug use: No   Sexual activity: Not on file  Other Topics Concern   Not on file  Social History Narrative   Not on file   Social Determinants of Health   Financial Resource Strain: Not on file  Food Insecurity: Not on file  Transportation Needs: Not on file  Physical Activity: Not on file  Stress: Not on file  Social Connections: Not on file     Family History: The patient's family history includes CAD in her father; Cancer in her father, mother, and paternal grandmother; Depression in her father and sister; Diabetes in her paternal grandfather; Diverticulitis in her father; Hearing loss in her maternal grandmother; Thyroid nodules in her sister; Ulcerative colitis in her mother.  ROS:   Please see the history of present illness.    All other systems reviewed and are negative.  EKGs/Labs/Other Studies Reviewed:    The following studies were reviewed today: I discussed my  findings with the patient at length   Recent Labs: 12/01/2020: ALT 35; BUN 21; Creatinine 0.91; Hemoglobin 14.0; Platelet Count 551; Potassium 4.1; Sodium 139  Recent Lipid Panel    Component Value Date/Time   CHOL 163 08/27/2019 0739   CHOL 169 03/07/2019 0937   TRIG 81.0 08/27/2019 0739   HDL 61.10 08/27/2019 0739   HDL 72 03/07/2019 0937   CHOLHDL 3 08/27/2019 0739   VLDL 16.2 08/27/2019 0739   LDLCALC 86 08/27/2019 0739   LDLCALC 86 03/07/2019 0937    Physical Exam:  VS:  BP 106/60    Pulse 70    Ht 5\' 3"  (1.6 m)    Wt 166 lb (75.3 kg)    SpO2 96%    BMI 29.41 kg/m     Wt Readings from Last 3 Encounters:  04/01/21 166 lb (75.3 kg)  12/01/20 163 lb 11.2 oz (74.3 kg)  10/23/20 166 lb 6.4 oz (75.5 kg)     GEN: Patient is in no acute distress HEENT: Normal NECK: No JVD; No carotid bruits LYMPHATICS: No lymphadenopathy CARDIAC: Hear sounds regular, 2/6 systolic murmur at the apex. RESPIRATORY:  Clear to auscultation without rales, wheezing or rhonchi  ABDOMEN: Soft, non-tender, non-distended MUSCULOSKELETAL:  No edema; No deformity  SKIN: Warm and dry NEUROLOGIC:  Alert and oriented x 3 PSYCHIATRIC:  Normal affect   Signed, Jenean Lindau, MD  04/01/2021 1:48 PM     [facility]

## 2021-04-01 NOTE — Patient Instructions (Signed)
Medication Instructions:  Your physician recommends that you continue on your current medications as directed. Please refer to the Current Medication list given to you today.  *If you need a refill on your cardiac medications before your next appointment, please call your pharmacy*   Lab Work: Your physician recommends that you return for lab work in: the next few days You need to have labs done when you are fasting.  You can come Monday through Friday 8:30 am to 12:00 pm and 1:15 to 4:30. You do not need to make an appointment as the order has already been placed. The labs you are going to have done are BMET,  LFT and Lipids.  If you have labs (blood work) drawn today and your tests are completely normal, you will receive your results only by: MyChart Message (if you have MyChart) OR A paper copy in the mail If you have any lab test that is abnormal or we need to change your treatment, we will call you to review the results.   Testing/Procedures: None ordered   Follow-Up: At [facility], you and your health needs are our priority.  As part of our continuing mission to provide you with exceptional heart care, we have created designated Provider Care Teams.  These Care Teams include your primary Cardiologist (physician) and Advanced Practice Providers (APPs -  Physician Assistants and Nurse Practitioners) who all work together to provide you with the care you need, when you need it.  We recommend signing up for the patient portal called "MyChart".  Sign up information is provided on this After Visit Summary.  MyChart is used to connect with patients for Virtual Visits (Telemedicine).  Patients are able to view lab/test results, encounter notes, upcoming appointments, etc.  Non-urgent messages can be sent to your provider as well.   To learn more about what you can do with MyChart, go to [email or web address].    Your next appointment:   12 month(s)  The format for your next  appointment:   In Person  Provider:   Rajan Revankar, MD   Other Instructions NA  

## 2021-04-14 LAB — HEPATIC FUNCTION PANEL
ALT: 16 IU/L (ref 0–32)
AST: 22 IU/L (ref 0–40)
Albumin: 4.7 g/dL (ref 3.8–4.9)
Alkaline Phosphatase: 100 IU/L (ref 44–121)
Bilirubin Total: 0.3 mg/dL (ref 0.0–1.2)
Bilirubin, Direct: <0.1 mg/dL (ref 0.00–0.40)
Total Protein: 7.3 g/dL (ref 6.0–8.5)

## 2021-04-14 LAB — BASIC METABOLIC PANEL
BUN/Creatinine Ratio: 18 (ref 9–23)
BUN: 15 mg/dL (ref 6–24)
CO2: 23 mmol/L (ref 20–29)
Calcium: 10 mg/dL (ref 8.7–10.2)
Chloride: 104 mmol/L (ref 96–106)
Creatinine, Ser: 0.84 mg/dL (ref 0.57–1.00)
Glucose: 102 mg/dL — ABNORMAL HIGH (ref 70–99)
Potassium: 4.5 mmol/L (ref 3.5–5.2)
Sodium: 140 mmol/L (ref 134–144)
eGFR: 83 mL/min/{1.73_m2} (ref >59)

## 2021-04-14 LAB — LIPID PANEL
Chol/HDL Ratio: 2.5 ratio (ref 0.0–4.4)
Cholesterol, Total: 159 mg/dL (ref 100–199)
HDL: 63 mg/dL (ref >39)
LDL Chol Calc (NIH): 82 mg/dL (ref 0–99)
Triglycerides: 73 mg/dL (ref 0–149)
VLDL Cholesterol Cal: 14 mg/dL (ref 5–40)

## 2021-04-15 ENCOUNTER — Other Ambulatory Visit (HOSPITAL_COMMUNITY): Payer: Self-pay

## 2021-04-16 ENCOUNTER — Encounter (HOSPITAL_COMMUNITY): Payer: 59

## 2021-05-05 ENCOUNTER — Other Ambulatory Visit: Payer: Self-pay

## 2021-05-05 ENCOUNTER — Ambulatory Visit (HOSPITAL_COMMUNITY)
Admission: RE | Admit: 2021-05-05 | Discharge: 2021-05-05 | Disposition: A | Discharge status: Home / Self Care | Payer: 59 | Source: Ambulatory Visit | Attending: Internal Medicine | Admitting: Internal Medicine

## 2021-05-05 DIAGNOSIS — E2740 Unspecified adrenocortical insufficiency: Secondary | ICD-10-CM | POA: Insufficient documentation

## 2021-05-05 LAB — ACTH STIMULATION, 3 TIME POINTS
Cortisol, 30 Min: 21.3 ug/dL
Cortisol, 60 Min: 24.4 ug/dL
Cortisol, Base: 11.1 ug/dL

## 2021-05-05 LAB — TSH: TSH: 5.458 u[IU]/mL — ABNORMAL HIGH (ref 0.350–4.500)

## 2021-05-05 MED ORDER — COSYNTROPIN 0.25 MG IJ SOLR: 0.2500 mg | Freq: Once | INTRAMUSCULAR | Status: AC

## 2021-05-05 MED ORDER — COSYNTROPIN 0.25 MG IJ SOLR
INTRAMUSCULAR | Status: AC
  Administered 2021-05-05: 0.25 mg via INTRAMUSCULAR
  Filled 2021-05-05: qty 0.25

## 2021-05-06 LAB — ACTH: C206 ACTH: 5.5 pg/mL — ABNORMAL LOW (ref 7.2–63.3)

## 2021-08-18 ENCOUNTER — Other Ambulatory Visit: Payer: Self-pay | Admitting: Internal Medicine

## 2021-08-18 DIAGNOSIS — R61 Generalized hyperhidrosis: Secondary | ICD-10-CM

## 2021-08-18 DIAGNOSIS — E349 Endocrine disorder, unspecified: Secondary | ICD-10-CM

## 2021-08-18 DIAGNOSIS — R7989 Other specified abnormal findings of blood chemistry: Secondary | ICD-10-CM

## 2021-08-25 DIAGNOSIS — E039 Hypothyroidism, unspecified: Secondary | ICD-10-CM

## 2021-08-25 HISTORY — DX: Hypothyroidism, unspecified: E03.9

## 2021-08-31 DIAGNOSIS — K638219 Small intestinal bacterial overgrowth, unspecified: Secondary | ICD-10-CM | POA: Insufficient documentation

## 2021-08-31 DIAGNOSIS — M6289 Other specified disorders of muscle: Secondary | ICD-10-CM | POA: Insufficient documentation

## 2021-08-31 DIAGNOSIS — G4730 Sleep apnea, unspecified: Secondary | ICD-10-CM | POA: Insufficient documentation

## 2021-08-31 HISTORY — DX: Sleep apnea, unspecified: G47.30

## 2021-08-31 HISTORY — DX: Small intestinal bacterial overgrowth, unspecified: K63.8219

## 2021-08-31 HISTORY — DX: Other specified disorders of muscle: M62.89

## 2021-09-03 ENCOUNTER — Ambulatory Visit
Admission: RE | Admit: 2021-09-03 | Discharge: 2021-09-03 | Disposition: A | Discharge status: Home / Self Care | Payer: 59 | Source: Ambulatory Visit | Attending: Internal Medicine | Admitting: Internal Medicine

## 2021-09-03 DIAGNOSIS — R61 Generalized hyperhidrosis: Secondary | ICD-10-CM

## 2021-09-03 DIAGNOSIS — E349 Endocrine disorder, unspecified: Secondary | ICD-10-CM

## 2021-09-03 DIAGNOSIS — R7989 Other specified abnormal findings of blood chemistry: Secondary | ICD-10-CM

## 2021-09-11 ENCOUNTER — Other Ambulatory Visit: Payer: 59

## 2021-11-26 NOTE — Progress Notes (Signed)
Patient Care Team: Nickola Major, MD as PCP - General (Family Medicine)  DIAGNOSIS: No diagnosis found.  SUMMARY OF ONCOLOGIC HISTORY: Oncology History   No history exists.    CHIEF COMPLIANT: Follow-up of thrombocytosis    INTERVAL HISTORY: Deborah [name] is a 35 y .o. with above-mentioned history of thrombocytosis. She presents to the clinic today for follow-up.      ALLERGIES:  is allergic to amoxicillin, norgestimate-eth estradiol, ivp dye [iodinated contrast media], penicillins, and meloxicam.  MEDICATIONS:  Current Outpatient Medications  Medication Sig Dispense Refill   aspirin EC 81 MG tablet Take 1 tablet (81 mg total) by mouth daily.     atorvastatin (LIPITOR) 10 MG tablet TAKE 1 TABLET(10 MG) BY MOUTH DAILY 90 tablet 2   Cholecalciferol (VITAMIN D3) 1000 units CAPS Take 3,000 Units by mouth 3 (three) times a week.     Docusate Calcium (STOOL SOFTENER PO) Take 1 tablet by mouth daily.      fluticasone (FLONASE) 50 MCG/ACT nasal spray Place 2 sprays into the nose daily.     levocetirizine (XYZAL) 5 MG tablet Take 5 mg by mouth every evening.     montelukast (SINGULAIR) 10 MG tablet Take 10 mg by mouth at bedtime.     Probiotic Product (ALIGN PO) Take 1 tablet by mouth daily.     valACYclovir (VALTREX) 500 MG tablet Take 500 mg by mouth daily.     vitamin B-12 (CYANOCOBALAMIN) 1000 MCG tablet Take 1,000 mcg by mouth 3 (three) times a week.      zolpidem (AMBIEN) 5 MG tablet Take 5 mg by mouth at bedtime as needed for sleep.     No current facility-administered medications for this visit.    PHYSICAL EXAMINATION: ECOG PERFORMANCE STATUS: {CHL ONC ECOG [phone]}  There were no vitals filed for this visit. There were no vitals filed for this visit.  BREAST:*** No palpable masses or nodules in either right or left breasts. No palpable axillary supraclavicular or infraclavicular adenopathy no breast tenderness or nipple discharge. (exam performed in the  presence of a chaperone)  LABORATORY DATA:  I have reviewed the data as listed    Latest Ref Rng & Units 04/13/2021   10:11 AM 12/01/2020    8:03 AM 08/27/2019    7:39 AM  CMP  Glucose 70 - 99 mg/dL 102  109  95   BUN 6 - 24 mg/dL '15  21  21   '$ Creatinine 0.57 - 1.00 mg/dL 0.84  0.91  0.82   Sodium 134 - 144 mmol/L 140  139  138   Potassium 3.5 - 5.2 mmol/L 4.5  4.1  4.1   Chloride 96 - 106 mmol/L 104  105  104   CO2 20 - 29 mmol/L '23  23  28   '$ Calcium 8.7 - 10.2 mg/dL 10.0  9.8  9.3   Total Protein 6.0 - 8.5 g/dL 7.3  7.5  6.5   Total Bilirubin 0.0 - 1.2 mg/dL 0.3  0.3  0.4   Alkaline Phos 44 - 121 IU/L 100  96  72   AST 0 - 40 IU/L '22  27  16   '$ ALT 0 - 32 IU/L 16  35  16     Lab Results  Component Value Date   WBC 10.8 (H) 12/01/2020   HGB 14.0 12/01/2020   HCT 40.3 12/01/2020   MCV 96.2 12/01/2020   PLT 551 (H) 12/01/2020   NEUTROABS 3.6 12/01/2020  ASSESSMENT & PLAN:  No problem-specific Assessment & Plan notes found for this encounter.    No orders of the defined types were placed in this encounter.  The patient has a good understanding of the overall plan. she agrees with it. she will call with any problems that may develop before the next visit here. Total time spent: 30 mins including face to face time and time spent for planning, charting and co-ordination of care   Suzzette Righter, [place] 11/26/21    I Gardiner Coins am scribing for Dr. Lindi Adie  ***

## 2021-11-30 ENCOUNTER — Other Ambulatory Visit: Payer: Self-pay

## 2021-11-30 ENCOUNTER — Inpatient Hospital Stay: Payer: 59 | Attending: Hematology and Oncology | Admitting: Hematology and Oncology

## 2021-11-30 ENCOUNTER — Inpatient Hospital Stay: Payer: 59

## 2021-11-30 DIAGNOSIS — D75839 Thrombocytosis, unspecified: Secondary | ICD-10-CM

## 2021-11-30 DIAGNOSIS — K922 Gastrointestinal hemorrhage, unspecified: Secondary | ICD-10-CM | POA: Insufficient documentation

## 2021-11-30 DIAGNOSIS — Z7982 Long term (current) use of aspirin: Secondary | ICD-10-CM | POA: Diagnosis not present

## 2021-11-30 DIAGNOSIS — Z9081 Acquired absence of spleen: Secondary | ICD-10-CM | POA: Insufficient documentation

## 2021-11-30 DIAGNOSIS — D72820 Lymphocytosis (symptomatic): Secondary | ICD-10-CM | POA: Diagnosis not present

## 2021-11-30 LAB — CBC WITH DIFFERENTIAL (CANCER CENTER ONLY)
Abs Immature Granulocytes: 0.01 10*3/uL (ref 0.00–0.07)
Basophils Absolute: 0.1 10*3/uL (ref 0.0–0.1)
Basophils Relative: 1 %
Eosinophils Absolute: 0.7 10*3/uL — ABNORMAL HIGH (ref 0.0–0.5)
Eosinophils Relative: 7 %
HCT: 39.5 % (ref 36.0–46.0)
Hemoglobin: 13.9 g/dL (ref 12.0–15.0)
Immature Granulocytes: 0 %
Lymphocytes Relative: 49 %
Lymphs Abs: 4.5 10*3/uL — ABNORMAL HIGH (ref 0.7–4.0)
MCH: 33.2 pg (ref 26.0–34.0)
MCHC: 35.2 g/dL (ref 30.0–36.0)
MCV: 94.3 fL (ref 80.0–100.0)
Monocytes Absolute: 0.6 10*3/uL (ref 0.1–1.0)
Monocytes Relative: 6 %
Neutro Abs: 3.5 10*3/uL (ref 1.7–7.7)
Neutrophils Relative %: 37 %
Platelet Count: 475 10*3/uL — ABNORMAL HIGH (ref 150–400)
RBC: 4.19 MIL/uL (ref 3.87–5.11)
RDW: 15.3 % (ref 11.5–15.5)
WBC Count: 9.4 10*3/uL (ref 4.0–10.5)
nRBC: 0 % (ref 0.0–0.2)

## 2021-11-30 NOTE — Assessment & Plan Note (Signed)
Thrombocytosis: Review of the labs revealed platelet counts469 on 06/07/2016.Patient has had mild elevation of plateletsatleastfor the last 4 years (538 in 2014; 571 in 2016 and 490 six months ago) 11/01/2018: 494 12/03/2019: 486  12/01/2020: 551 11/30/2021:  Platelet count has not changed much in the last 7 years.  Probable cause: Splenectomy 20 years ago  Lymphocytosis: Flow cytometry: 2021: No evidence of CLL.  CD4 negative CD8 negative inflammatory T cells noted Current treatment: Aspirin 81 mg daily

## 2021-12-31 ENCOUNTER — Telehealth: Payer: Self-pay | Admitting: Cardiology

## 2021-12-31 ENCOUNTER — Other Ambulatory Visit: Payer: Self-pay | Admitting: Cardiology

## 2021-12-31 MED ORDER — ATORVASTATIN CALCIUM 10 MG PO TABS: 10.0000 mg | ORAL_TABLET | Freq: Every day | ORAL | 0 refills | Status: DC

## 2021-12-31 NOTE — Telephone Encounter (Signed)
Rx refill sent to pharmacy. 

## 2021-12-31 NOTE — Telephone Encounter (Signed)
*  STAT* If patient is at the pharmacy, call can be transferred to refill team.   1. Which medications need to be refilled? (please list name of each medication and dose if known) atorvastatin (LIPITOR) 10 MG tablet  2. Which pharmacy/location (including street and city if local pharmacy) is medication to be sent to? [facility] [place], [place] AT [address]  3. Do they need a 30 day or 90 day supply? 90  Pt made an appt for 04/13/22 with Dr. Geraldo Pitter, pt completely out

## 2022-04-03 ENCOUNTER — Other Ambulatory Visit: Payer: Self-pay | Admitting: Cardiology

## 2022-04-05 ENCOUNTER — Other Ambulatory Visit: Payer: Self-pay | Admitting: Cardiology

## 2022-04-07 ENCOUNTER — Other Ambulatory Visit: Payer: Self-pay

## 2022-04-13 ENCOUNTER — Encounter: Payer: Self-pay | Admitting: Cardiology

## 2022-04-13 ENCOUNTER — Ambulatory Visit: Payer: 59 | Attending: Cardiology | Admitting: Cardiology

## 2022-04-13 VITALS — BP 100/62 | HR 61 | Ht 63.0 in | Wt 161.1 lb

## 2022-04-13 DIAGNOSIS — E782 Mixed hyperlipidemia: Secondary | ICD-10-CM | POA: Diagnosis not present

## 2022-04-13 DIAGNOSIS — R0789 Other chest pain: Secondary | ICD-10-CM | POA: Diagnosis not present

## 2022-04-13 DIAGNOSIS — G4730 Sleep apnea, unspecified: Secondary | ICD-10-CM

## 2022-04-13 HISTORY — DX: Sleep apnea, unspecified: G47.30

## 2022-04-13 MED ORDER — ATORVASTATIN CALCIUM 10 MG PO TABS: 10.0000 mg | ORAL_TABLET | Freq: Every day | ORAL | 3 refills | Status: DC

## 2022-04-13 NOTE — Patient Instructions (Signed)
Medication Instructions:  Your physician recommends that you continue on your current medications as directed. Please refer to the Current Medication list given to you today.  *If you need a refill on your cardiac medications before your next appointment, please call your pharmacy*   Lab Work: Your physician recommends that you have a CMP and lipids today. If you have labs (blood work) drawn today and your tests are completely normal, you will receive your results only by: [place] (if you have MyChart) OR A paper copy in the mail If you have any lab test that is abnormal or we need to change your treatment, we will call you to review the results.   Testing/Procedures: We will order CT coronary calcium score. It will cost $99.00 and is due at time of scan.  Please call to schedule.     [facility] [address] [place], [place] [address] [phone]      Follow-Up: At [facility], you and your health needs are our priority.  As part of our continuing mission to provide you with exceptional heart care, we have created designated Provider Care Teams.  These Care Teams include your primary Cardiologist (physician) and Advanced Practice Providers (APPs -  Physician Assistants and Nurse Practitioners) who all work together to provide you with the care you need, when you need it.  We recommend signing up for the patient portal called "MyChart".  Sign up information is provided on this After Visit Summary.  MyChart is used to connect with patients for Virtual Visits (Telemedicine).  Patients are able to view lab/test results, encounter notes, upcoming appointments, etc.  Non-urgent messages can be sent to your provider as well.   To learn more about what you can do with MyChart, go to [email or web address].    Your next appointment:   12 month(s)  The format for your next appointment:   In Person  Provider:   Jyl Heinz, MD    Other  Instructions none  Important Information About Sugar

## 2022-04-13 NOTE — Progress Notes (Signed)
Cardiology Office Note:    Date:  04/13/2022   ID:  Deborah [name], DOB Aug 07, 1965, MRN [id number]  PCP:  Nickola Major, MD  Cardiologist:  Jenean Lindau, MD   Referring MD: Nickola Major, MD    ASSESSMENT:    1. Mixed dyslipidemia   2. Chest discomfort   3. Sleep apnea, unspecified type    PLAN:    In order of problems listed above:  Primary prevention stressed with the patient.  Importance of compliance with diet medication stressed and she vocalized understanding.  Unfortunately she cannot exercise for a long time because of orthopedic issues involving the knee. Mixed dyslipidemia: On statin therapy.  Will do blood work today and advise her accordingly.  Diet emphasized. Snoring and possible sleep apnea: Patient is evaluated by the specialist for this.  She is in the process of being evaluated for sleep apnea. Coronary risk stratification: CT calcium scoring was advised and she is agreeable. Patient will be seen in follow-up appointment in 12 months or earlier if the patient has any concerns    Medication Adjustments/Labs and Tests Ordered: Current medicines are reviewed at length with the patient today.  Concerns regarding medicines are outlined above.  Orders Placed This Encounter  Procedures   CT CARDIAC SCORING   Comprehensive metabolic panel   Lipid panel   EKG 12-Lead   Meds ordered this encounter  Medications   atorvastatin (LIPITOR) 10 MG tablet    Sig: Take 1 tablet (10 mg total) by mouth daily.    Dispense:  90 tablet    Refill:  3     No chief complaint on file.    History of Present Illness:    Deborah [name] is a 57 y.o. female.  Patient has past medical history of mixed dyslipidemia next possible sleep apnea.  She denies any problems at this time and takes care of activities of daily living.  No chest pain orthopnea or PND.  At the time of my evaluation, the patient is alert awake oriented and in no distress.  Past Medical  History:  Diagnosis Date   Abdominal pain, generalized 11/28/2009   Qualifier: Diagnosis of  By: Trellis Paganini PA-c, Amy S    Abdominal pain, left upper quadrant 05/29/2008   Qualifier: Diagnosis of  By: Nelson-Smith CMA (AAMA), Dottie     ABDOMINAL PAIN-EPIGASTRIC 11/28/2009   Qualifier: Diagnosis of  By: Trellis Paganini PA-c, Amy S    Abnormal CT scan, colon 12/31/2015   Abnormal weight gain 11/28/2009   Qualifier: Diagnosis of  By: Trellis Paganini PA-c, Amy S    Allergic rhinitis 12/02/2020   Allergic rhinitis due to animal (cat) (dog) hair and dander 03/25/2021   Allergic rhinitis due to pollen 03/25/2021   Asplenia 08/27/2019   Cardiac murmur 10/27/2018   Chronic allergic conjunctivitis 03/25/2021   Chronic constipation    Chronic pancreatitis ([place]) LOW-GRADE   FOLLOWED BY DR DORA BRODIE   COLONIC POLYPS, HX OF 05/23/2008   Qualifier: Diagnosis of  By: Nelson-Smith CMA (AAMA), Dottie     Cough 03/25/2021   Genital herpes 10/25/2018   Genital herpes in women    Hot flashes    Hypothyroidism 08/25/2021   IBS (irritable bowel syndrome)    Insomnia 08/27/2019   Irritable bowel syndrome 05/11/2007   Qualifier: Diagnosis of  By: Arsenio Loader RN, Carissa     Menorrhagia    Mixed dyslipidemia 03/07/2019   PANCREATITIS 05/11/2007   Qualifier: Diagnosis of  By: Arsenio Loader RN,  Carissa     Pelvic floor dysfunction 08/31/2021   Perimenopausal    Plantar fasciitis, bilateral 08/27/2019   Prediabetes 06/02/2020   Right knee pain    swelling and cortizone shot given and melxicam   SCOLIOSIS 05/23/2008   Qualifier: Diagnosis of  By: Nelson-Smith CMA (AAMA), Dottie     Seasonal allergies    Sleep disorder breathing 08/31/2021   Small intestinal bacterial overgrowth (SIBO) 08/31/2021   Thrombocytosis 07/13/2016   Vitamin B12 deficiency 03/07/2019   Vitamin D deficiency 03/07/2019    Past Surgical History:  Procedure Laterality Date   ABLATION  2014   APPENDECTOMY  5/21/197   Incidental appy   KNEE  ARTHROSCOPY  2004   RIGHT   SPLENECTOMY, TOTAL  08/10/1995   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   Cholecalciferol (VITAMIN D3) 1000 units CAPS Take 3,000 Units by mouth 3 (three) times a week.   Docusate Calcium (STOOL SOFTENER PO) Take 1 tablet by mouth daily.    doxycycline (VIBRAMYCIN) 100 MG capsule Take 100 mg by mouth 2 (two) times daily.   EPINEPHrine 0.3 mg/0.3 mL IJ SOAJ injection Inject 0.3 mg into the muscle as needed for anaphylaxis.   fluticasone (FLONASE) 50 MCG/ACT nasal spray Place 2 sprays into the nose daily.   levocetirizine (XYZAL) 5 MG tablet Take 5 mg by mouth every evening.   levothyroxine (SYNTHROID) 50 MCG tablet Take 50 mcg by mouth every morning.   montelukast (SINGULAIR) 10 MG tablet Take 10 mg by mouth at bedtime.   Probiotic Product (ALIGN PO) Take 1 tablet by mouth daily.   valACYclovir (VALTREX) 500 MG tablet Take 500 mg by mouth daily.   valACYclovir (VALTREX) 500 MG tablet Take 1 tablet by mouth daily.   venlafaxine XR (EFFEXOR-XR) 37.5 MG 24 hr capsule Take 37.5 mg by mouth daily.   vitamin B-12 (CYANOCOBALAMIN) 1000 MCG tablet Take 1,000 mcg by mouth 3 (three) times a week.    zolpidem (AMBIEN) 5 MG tablet Take 5 mg by mouth at bedtime as needed for sleep.   [DISCONTINUED] atorvastatin (LIPITOR) 10 MG tablet Take 1 tablet (10 mg total) by mouth daily. Patient needs appointment for further refills. 1 st attempt     Allergies:   Amoxicillin, Norgestimate-eth estradiol, Ivp dye [iodinated contrast media], Penicillins, and Meloxicam   Social History   Socioeconomic History   Marital status: Single    Spouse name: Not on file   Number of children: 2   Years of education: Not on file   Highest education level: Not on file  Occupational History   Occupation: librarian  Tobacco Use   Smoking status: Never    Smokeless tobacco: Never  Vaping Use   Vaping Use: Never used  Substance and Sexual Activity   Alcohol use: No   Drug use: No   Sexual activity: Not on file  Other Topics Concern   Not on file  Social History Narrative   Not on file   Social Determinants of Health   Financial Resource Strain: Not on file  Food Insecurity: Not on file  Transportation Needs: Not on file  Physical Activity: Not on file  Stress: Not on file  Social Connections: Not on file     Family History: The patient's family history includes CAD in her father; Cancer in her father,  mother, and paternal grandmother; Depression in her father and sister; Diabetes in her paternal grandfather; Diverticulitis in her father; Hearing loss in her maternal grandmother; Thyroid nodules in her sister; Ulcerative colitis in her mother.  ROS:   Please see the history of present illness.    All other systems reviewed and are negative.  EKGs/Labs/Other Studies Reviewed:    The following studies were reviewed today: EKG reveals sinus rhythm and nonspecific ST-T changes.   Recent Labs: 04/13/2021: ALT 16; BUN 15; Creatinine, Ser 0.84; Potassium 4.5; Sodium 140 05/05/2021: TSH 5.458 11/30/2021: Hemoglobin 13.9; Platelet Count 475  Recent Lipid Panel    Component Value Date/Time   CHOL 159 04/13/2021 1011   TRIG 73 04/13/2021 1011   HDL 63 04/13/2021 1011   CHOLHDL 2.5 04/13/2021 1011   CHOLHDL 3 08/27/2019 0739   VLDL 16.2 08/27/2019 0739   LDLCALC 82 04/13/2021 1011    Physical Exam:    VS:  BP 100/62   Pulse 61   Ht '5\' 3"'$  (1.6 m)   Wt 161 lb 1.3 oz (73.1 kg)   SpO2 95%   BMI 28.53 kg/m     Wt Readings from Last 3 Encounters:  04/13/22 161 lb 1.3 oz (73.1 kg)  11/30/21 156 lb 12.8 oz (71.1 kg)  05/05/21 160 lb 5 oz (72.7 kg)     GEN: Patient is in no acute distress HEENT: Normal NECK: No JVD; No carotid bruits LYMPHATICS: No lymphadenopathy CARDIAC: Hear sounds regular, 2/6 systolic murmur at the  apex. RESPIRATORY:  Clear to auscultation without rales, wheezing or rhonchi  ABDOMEN: Soft, non-tender, non-distended MUSCULOSKELETAL:  No edema; No deformity  SKIN: Warm and dry NEUROLOGIC:  Alert and oriented x 3 PSYCHIATRIC:  Normal affect   Signed, Jenean Lindau, MD  04/13/2022 8:43 AM    [place]

## 2022-04-14 LAB — COMPREHENSIVE METABOLIC PANEL
ALT: 27 IU/L (ref 0–32)
AST: 25 IU/L (ref 0–40)
Albumin/Globulin Ratio: 1.7 (ref 1.2–2.2)
Albumin: 4.5 g/dL (ref 3.8–4.9)
Alkaline Phosphatase: 120 IU/L (ref 44–121)
BUN/Creatinine Ratio: 19 (ref 9–23)
BUN: 16 mg/dL (ref 6–24)
Bilirubin Total: 0.3 mg/dL (ref 0.0–1.2)
CO2: 24 mmol/L (ref 20–29)
Calcium: 9.9 mg/dL (ref 8.7–10.2)
Chloride: 102 mmol/L (ref 96–106)
Creatinine, Ser: 0.86 mg/dL (ref 0.57–1.00)
Globulin, Total: 2.7 g/dL (ref 1.5–4.5)
Glucose: 102 mg/dL — ABNORMAL HIGH (ref 70–99)
Potassium: 4.7 mmol/L (ref 3.5–5.2)
Sodium: 141 mmol/L (ref 134–144)
Total Protein: 7.2 g/dL (ref 6.0–8.5)
eGFR: 80 mL/min/{1.73_m2} (ref >59)

## 2022-04-14 LAB — LIPID PANEL
Chol/HDL Ratio: 2.6 ratio (ref 0.0–4.4)
Cholesterol, Total: 190 mg/dL (ref 100–199)
HDL: 72 mg/dL (ref >39)
LDL Chol Calc (NIH): 103 mg/dL — ABNORMAL HIGH (ref 0–99)
Triglycerides: 85 mg/dL (ref 0–149)
VLDL Cholesterol Cal: 15 mg/dL (ref 5–40)

## 2022-04-19 ENCOUNTER — Ambulatory Visit (HOSPITAL_BASED_OUTPATIENT_CLINIC_OR_DEPARTMENT_OTHER)
Admission: RE | Admit: 2022-04-19 | Discharge: 2022-04-19 | Disposition: A | Discharge status: Home / Self Care | Payer: 59 | Source: Ambulatory Visit | Attending: Cardiology | Admitting: Cardiology

## 2022-04-19 DIAGNOSIS — E782 Mixed hyperlipidemia: Secondary | ICD-10-CM | POA: Insufficient documentation

## 2022-04-21 ENCOUNTER — Telehealth: Payer: Self-pay

## 2022-04-21 NOTE — Telephone Encounter (Signed)
-----  Message from Jenean Lindau, MD sent at 04/21/2022 12:37 PM EST ----- The results of the study is unremarkable. Please inform patient. I will discuss in detail at next appointment. Cc  primary care/referring physician Jenean Lindau, MD 04/21/2022 12:36 PM

## 2022-04-21 NOTE — Telephone Encounter (Signed)
Patient notified of results through my chart  , results forward to PCP

## 2022-10-09 ENCOUNTER — Ambulatory Visit
Admission: RE | Admit: 2022-10-09 | Discharge: 2022-10-09 | Disposition: A | Discharge status: Home / Self Care | Payer: 59 | Source: Ambulatory Visit

## 2022-10-09 VITALS — BP 108/73 | HR 95 | Temp 102.6°F | Resp 18 | Ht 63.0 in | Wt 160.0 lb

## 2022-10-09 DIAGNOSIS — U071 COVID-19: Secondary | ICD-10-CM

## 2022-10-09 MED ORDER — PAXLOVID (300/100) 20 X 150 MG & 10 X 100MG PO TBPK: 3.0000 | ORAL_TABLET | Freq: Two times a day (BID) | ORAL | 0 refills | Status: AC

## 2022-10-09 MED ORDER — ACETAMINOPHEN 325 MG PO TABS
650.0000 mg | ORAL_TABLET | Freq: Once | ORAL | Status: AC
  Administered 2022-10-09: 650 mg via ORAL

## 2022-10-09 NOTE — ED Triage Notes (Signed)
Tested positive for Covid on at home test, fluid filled ears, sore throat, coughing,  nose, headaches. I also don't have a spleen - Entered by patient

## 2022-10-09 NOTE — ED Provider Notes (Signed)
[facility]    CSN: [id number] Arrival date & time: 10/09/22  1341      History   Chief Complaint Chief Complaint  Patient presents with   Sore Throat    HPI ALEESA SWEIGERT is a 57 y.o. female with a history of splenectomy, hypothyroidism, prediabetes presents to urgent care today with complaint of headache, runny nose, nasal congestion, ear pain, sore throat, cough and shortness of breath.  She reports this started yesterday.  She is not blowing any mucus out of her nose.  She denies difficulty swallowing.  The cough is mostly nonproductive.  She denies chest pain, nausea, vomiting or diarrhea.  She reports fever and chills but denies body aches.  She has taken Tylenol OTC with minimal relief of symptoms.  She has not had sick contacts that she is aware of.  She took a home COVID test which was positive yesterday.  HPI  Past Medical History:  Diagnosis Date   Abdominal pain, generalized 11/28/2009   Qualifier: Diagnosis of  By: Monica Becton PA-c, Amy S    Abdominal pain, left upper quadrant 05/29/2008   Qualifier: Diagnosis of  By: Nelson-Smith CMA (AAMA), Dottie     ABDOMINAL PAIN-EPIGASTRIC 11/28/2009   Qualifier: Diagnosis of  By: Monica Becton PA-c, Amy S    Abnormal CT scan, colon 12/31/2015   Abnormal weight gain 11/28/2009   Qualifier: Diagnosis of  By: Monica Becton PA-c, Amy S    Allergic rhinitis 12/02/2020   Allergic rhinitis due to animal (cat) (dog) hair and dander 03/25/2021   Allergic rhinitis due to pollen 03/25/2021   Asplenia 08/27/2019   Cardiac murmur 10/27/2018   Chronic allergic conjunctivitis 03/25/2021   Chronic constipation    Chronic pancreatitis (HCC) LOW-GRADE   FOLLOWED BY DR DORA BRODIE   COLONIC POLYPS, HX OF 05/23/2008   Qualifier: Diagnosis of  By: Nelson-Smith CMA (AAMA), Dottie     Cough 03/25/2021   Genital herpes 10/25/2018   Genital herpes in women    Hot flashes    Hypothyroidism 08/25/2021   IBS (irritable bowel  syndrome)    Insomnia 08/27/2019   Irritable bowel syndrome 05/11/2007   Qualifier: Diagnosis of  By: Gwinda Passe RN, Carissa     Menorrhagia    Mixed dyslipidemia 03/07/2019   PANCREATITIS 05/11/2007   Qualifier: Diagnosis of  By: Gwinda Passe RN, Carissa     Pelvic floor dysfunction 08/31/2021   Perimenopausal    Plantar fasciitis, bilateral 08/27/2019   Prediabetes 06/02/2020   Right knee pain    swelling and cortizone shot given and melxicam   SCOLIOSIS 05/23/2008   Qualifier: Diagnosis of  By: Candice Camp CMA (AAMA), Dottie     Seasonal allergies    Sleep disorder breathing 08/31/2021   Small intestinal bacterial overgrowth (SIBO) 08/31/2021   Thrombocytosis 07/13/2016   Vitamin B12 deficiency 03/07/2019   Vitamin D deficiency 03/07/2019    Patient Active Problem List   Diagnosis Date Noted   Sleep apnea 04/13/2022   Pelvic floor dysfunction 08/31/2021   Sleep disorder breathing 08/31/2021   Small intestinal bacterial overgrowth (SIBO) 08/31/2021   Hypothyroidism 08/25/2021   Allergic rhinitis due to animal (cat) (dog) hair and dander 03/25/2021   Allergic rhinitis due to pollen 03/25/2021   Chronic allergic conjunctivitis 03/25/2021   Cough 03/25/2021   Allergic rhinitis 12/02/2020   Prediabetes 06/02/2020   Genital herpes in women    Hot flashes    IBS (irritable bowel syndrome)    Menorrhagia    Perimenopausal  Seasonal allergies    Insomnia 08/27/2019   Plantar fasciitis, bilateral 08/27/2019   Asplenia 08/27/2019   Mixed dyslipidemia 03/07/2019   Vitamin D deficiency 03/07/2019   Vitamin B12 deficiency 03/07/2019   Cardiac murmur 10/27/2018   Genital herpes 10/25/2018   Thrombocytosis 07/13/2016   Abnormal CT scan, colon 12/31/2015   Chronic constipation 02/15/2012   CHRONIC PANCREATITIS 11/28/2009   Abdominal pain, generalized 11/28/2009   ABDOMINAL PAIN-EPIGASTRIC 11/28/2009   Abnormal weight gain 11/28/2009   GENITAL HERPES 11/28/2009   Abdominal  pain, left upper quadrant 05/29/2008   COLONIC POLYPS, HX OF 05/23/2008   IRRITABLE BOWEL SYNDROME 05/11/2007   PANCREATITIS 05/11/2007    Past Surgical History:  Procedure Laterality Date   ABLATION  2014   APPENDECTOMY  5/21/197   Incidental appy   KNEE ARTHROSCOPY  2004   RIGHT   SPLENECTOMY, TOTAL  08/10/1995   For splwnic cyst - accessory spleen also removed    OB History   No obstetric history on file.      Home Medications    Prior to Admission medications   Medication Sig Start Date End Date Taking? Authorizing Provider  aspirin EC 81 MG tablet Take 1 tablet (81 mg total) by mouth daily. 11/20/18  Yes Serena Croissant, MD  atorvastatin (LIPITOR) 10 MG tablet Take 1 tablet (10 mg total) by mouth daily. 04/13/22  Yes Revankar, Aundra Dubin, MD  Cholecalciferol (VITAMIN D3) 1000 units CAPS Take 3,000 Units by mouth 3 (three) times a week.   Yes [provider]  Docusate Calcium (STOOL SOFTENER PO) Take 1 tablet by mouth daily.    Yes [provider]  fluticasone (FLONASE) 50 MCG/ACT nasal spray Place 2 sprays into the nose daily. 06/25/13  Yes [provider]  levocetirizine (XYZAL) 5 MG tablet Take 5 mg by mouth every evening.   Yes [provider]  levothyroxine (SYNTHROID) 50 MCG tablet Take 50 mcg by mouth every morning. 10/29/21  Yes [provider]  montelukast (SINGULAIR) 10 MG tablet Take 10 mg by mouth at bedtime.   Yes [provider]  nirmatrelvir & ritonavir (PAXLOVID, 300/100,) 20 x 150 MG & 10 x 100MG  TBPK Take 3 tablets by mouth 2 (two) times daily for 5 days. 10/09/22 10/14/22 Yes BaitySalvadore Oxford, NP  Probiotic Product (ALIGN PO) Take 1 tablet by mouth daily.   Yes [provider]  venlafaxine XR (EFFEXOR-XR) 37.5 MG 24 hr capsule Take 37.5 mg by mouth daily. 11/09/21  Yes [provider]  vitamin B-12 (CYANOCOBALAMIN) 1000 MCG tablet Take 1,000 mcg by mouth 3 (three) times a week.    Yes [provider]  zolpidem (AMBIEN) 5 MG tablet Take 5 mg by mouth at bedtime as needed for sleep. 03/08/21  Yes [provider]  doxycycline (VIBRAMYCIN) 100 MG capsule Take 100 mg by mouth 2 (two) times daily. 11/23/21   [provider]  EPINEPHrine 0.3 mg/0.3 mL IJ SOAJ injection Inject 0.3 mg into the muscle as needed for anaphylaxis.    [provider]  valACYclovir (VALTREX) 500 MG tablet Take 500 mg by mouth daily.    [provider]  valACYclovir (VALTREX) 500 MG tablet Take 1 tablet by mouth daily. 04/17/21   [provider]    Family History Family History  Problem Relation Age of Onset   Ulcerative colitis Mother    Cancer Mother        waldenstroms   Cancer Father  prostate cancer   CAD Father    Diverticulitis Father    Depression Father    Depression Sister    Thyroid nodules Sister    Hearing loss Maternal Grandmother    Cancer Paternal Grandmother        Ovarian cancer   Diabetes Paternal Grandfather     Social History Social History   Tobacco Use   Smoking status: Never   Smokeless tobacco: Never  Vaping Use   Vaping status: Never Used  Substance Use Topics   Alcohol use: No   Drug use: No     Allergies   Amoxicillin, Norgestimate-eth estradiol, Bupropion, Ivp dye [iodinated contrast media], Other, Penicillins, and Meloxicam   Review of Systems Review of Systems  Constitutional:  Positive for chills, fatigue and fever.  HENT:  Positive for congestion, ear pain, rhinorrhea and sore throat. Negative for sinus pressure and sinus pain.   Eyes:  Negative for pain and redness.  Respiratory:  Positive for cough and shortness of breath.   Cardiovascular:  Negative for chest pain.  Gastrointestinal:  Negative for diarrhea, nausea and vomiting.  Musculoskeletal:  Positive for arthralgias and myalgias.  Skin:  Negative for rash.  Neurological:  Positive for headaches. Negative for dizziness and weakness.      Physical Exam Triage Vital Signs ED Triage Vitals  Encounter Vitals Group     BP 10/09/22 1352 108/73     Systolic BP Percentile --      Diastolic BP Percentile --      Pulse Rate 10/09/22 1352 95     Resp 10/09/22 1352 18     Temp 10/09/22 1352 (!) 102.6 F (39.2 C)     Temp Source 10/09/22 1352 Oral     SpO2 10/09/22 1352 96 %     Weight 10/09/22 1349 160 lb (72.6 kg)     Height 10/09/22 1349 5\' 3"  (1.6 m)     Head Circumference --      Peak Flow --      Pain Score 10/09/22 1347 7     Pain Loc --      Pain Education --      Exclude from Growth Chart --    No data found.  Updated Vital Signs BP 108/73 (BP Location: Left Arm)   Pulse 95   Temp (!) 102.6 F (39.2 C) (Oral)   Resp 18   Ht 5\' 3"  (1.6 m)   Wt 160 lb (72.6 kg)   SpO2 96%   BMI 28.34 kg/m    Physical Exam Constitutional:      General: She is in acute distress.  HENT:     Head: Normocephalic.     Comments: Maxillary and frontal sinus pressure noted    Right Ear: Tympanic membrane and ear canal normal. No middle ear effusion.     Left Ear: Tympanic membrane and ear canal normal.  No middle ear effusion.     Nose: Congestion present. No rhinorrhea.     Mouth/Throat:     Pharynx: Posterior oropharyngeal erythema present. No pharyngeal swelling, oropharyngeal exudate or uvula swelling.  Eyes:     Conjunctiva/sclera: Conjunctivae normal.     Pupils: Pupils are equal, round, and reactive to light.  Cardiovascular:     Rate and Rhythm: Normal rate and regular rhythm.     Heart sounds: Normal heart sounds.  Pulmonary:     Effort: Pulmonary effort is normal.     Breath sounds: Normal breath sounds. No wheezing,  rhonchi or rales.  Lymphadenopathy:     Cervical: No cervical adenopathy.  Skin:    General: Skin is warm and dry.     Findings: No rash.  Neurological:     General: No focal deficit present.     Mental Status: She is alert and oriented to person, place, and time.      UC Treatments  / Results  Labs   EKG   Radiology No results found.  Procedures Procedures (including critical care time)  Medications Ordered in UC Medications  acetaminophen (TYLENOL) tablet 650 mg (650 mg Oral Given 10/09/22 1356)    Initial Impression / Assessment and Plan / UC Course  I have reviewed the triage vital signs and the nursing notes.  Pertinent labs & imaging results that were available during my care of the patient were reviewed by me and considered in my medical decision making (see chart for details).     57 year old with 1 day history of URI symptoms with a positive at-home COVID test.  No indication to repeat testing here.  She would like Paxlovid given her history of splenectomy.  Encouraged rest and fluids.  Recommend Zyrtec, Flonase, cough syrup OTC for symptom management.  She has an albuterol inhaler to use as needed for shortness of breath.  Advised her to follow-up with her PCP if symptoms persist or worsen.    Final Clinical Impressions(s) / UC Diagnoses   Final diagnoses:  COVID-19     Discharge Instructions      You were seen today for your COVID symptoms.  We are putting you on Paxlovid 2 times daily for the next 5 days.  We recommend rest and fluids.  You can take Tylenol or ibuprofen OTC as needed for fever and bodyaches.  You may want to consider an antihistamine for runny nose, nasal congestion and sore throat.  Flonase may also help with nasal congestion.  You can use a cough suppressant OTC such as Robitussin or Delsym.  You have an albuterol inhaler to use as needed for shortness of breath.  Please follow-up if your symptoms persist or worsen.     ED Prescriptions     Medication Sig Dispense Auth. Provider   nirmatrelvir & ritonavir (PAXLOVID, 300/100,) 20 x 150 MG & 10 x 100MG  TBPK Take 3 tablets by mouth 2 (two) times daily for 5 days. 30 tablet Lorre Munroe, NP      PDMP not reviewed this encounter.   Lorre Munroe, NP 10/09/22  [phone]

## 2022-10-09 NOTE — Discharge Instructions (Addendum)
You were seen today for your COVID symptoms.  We are putting you on Paxlovid 2 times daily for the next 5 days.  We recommend rest and fluids.  You can take Tylenol or ibuprofen OTC as needed for fever and bodyaches.  You may want to consider an antihistamine for runny nose, nasal congestion and sore throat.  Flonase may also help with nasal congestion.  You can use a cough suppressant OTC such as Robitussin or Delsym.  You have an albuterol inhaler to use as needed for shortness of breath.  Please follow-up if your symptoms persist or worsen.

## 2022-12-02 ENCOUNTER — Other Ambulatory Visit: Payer: Self-pay

## 2022-12-02 DIAGNOSIS — D75839 Thrombocytosis, unspecified: Secondary | ICD-10-CM

## 2022-12-06 ENCOUNTER — Inpatient Hospital Stay: Payer: 59 | Attending: Hematology and Oncology | Admitting: Hematology and Oncology

## 2022-12-06 ENCOUNTER — Inpatient Hospital Stay: Payer: 59

## 2022-12-06 VITALS — BP 114/71 | HR 64 | Temp 97.3°F | Resp 18 | Ht 63.0 in | Wt 164.3 lb

## 2022-12-06 DIAGNOSIS — D72820 Lymphocytosis (symptomatic): Secondary | ICD-10-CM | POA: Diagnosis not present

## 2022-12-06 DIAGNOSIS — D75839 Thrombocytosis, unspecified: Secondary | ICD-10-CM

## 2022-12-06 DIAGNOSIS — N951 Menopausal and female climacteric states: Secondary | ICD-10-CM | POA: Insufficient documentation

## 2022-12-06 DIAGNOSIS — R109 Unspecified abdominal pain: Secondary | ICD-10-CM | POA: Diagnosis not present

## 2022-12-06 DIAGNOSIS — Z79890 Hormone replacement therapy (postmenopausal): Secondary | ICD-10-CM | POA: Insufficient documentation

## 2022-12-06 LAB — CBC WITH DIFFERENTIAL (CANCER CENTER ONLY)
Abs Immature Granulocytes: 0.02 10*3/uL (ref 0.00–0.07)
Basophils Absolute: 0.2 10*3/uL — ABNORMAL HIGH (ref 0.0–0.1)
Basophils Relative: 1 %
Eosinophils Absolute: 0.8 10*3/uL — ABNORMAL HIGH (ref 0.0–0.5)
Eosinophils Relative: 7 %
HCT: 41 % (ref 36.0–46.0)
Hemoglobin: 14.2 g/dL (ref 12.0–15.0)
Immature Granulocytes: 0 %
Lymphocytes Relative: 46 %
Lymphs Abs: 4.8 10*3/uL — ABNORMAL HIGH (ref 0.7–4.0)
MCH: 33 pg (ref 26.0–34.0)
MCHC: 34.6 g/dL (ref 30.0–36.0)
MCV: 95.3 fL (ref 80.0–100.0)
Monocytes Absolute: 0.6 10*3/uL (ref 0.1–1.0)
Monocytes Relative: 6 %
Neutro Abs: 4.2 10*3/uL (ref 1.7–7.7)
Neutrophils Relative %: 40 %
Platelet Count: 471 10*3/uL — ABNORMAL HIGH (ref 150–400)
RBC: 4.3 MIL/uL (ref 3.87–5.11)
RDW: 14.8 % (ref 11.5–15.5)
WBC Count: 10.5 10*3/uL (ref 4.0–10.5)
nRBC: 0 % (ref 0.0–0.2)

## 2022-12-06 NOTE — Progress Notes (Signed)
Patient Care Team: Gwenlyn Found, MD as PCP - General (Family Medicine)  DIAGNOSIS:  Encounter Diagnosis  Name Primary?   Thrombocytosis Yes    SUMMARY OF ONCOLOGIC HISTORY: Oncology History   No history exists.    CHIEF COMPLIANT:   Discussed the use of AI scribe software for clinical note transcription with the patient, who gave verbal consent to proceed.  History of Present Illness   The patient, with a history of thrombocytosis, presents for her annual follow-up. She reports that her health has been generally good, with the exception of fatigue and overall achiness. She describes a stiffness that occurs after sitting for any length of time. She also reports chronic stomach pain, but no worsening of her gastrointestinal symptoms. She continues to take a low-dose aspirin and has recently started levothyroxine.         ALLERGIES:  is allergic to amoxicillin, norgestimate-eth estradiol, bupropion, ivp dye [iodinated contrast media], other, penicillins, and meloxicam.  MEDICATIONS:  Current Outpatient Medications  Medication Sig Dispense Refill   aspirin EC 81 MG tablet Take 1 tablet (81 mg total) by mouth daily.     atorvastatin (LIPITOR) 10 MG tablet Take 1 tablet (10 mg total) by mouth daily. 90 tablet 3   Cholecalciferol (VITAMIN D3) 1000 units CAPS Take 3,000 Units by mouth 3 (three) times a week.     Docusate Calcium (STOOL SOFTENER PO) Take 1 tablet by mouth daily.      EPINEPHrine 0.3 mg/0.3 mL IJ SOAJ injection Inject 0.3 mg into the muscle as needed for anaphylaxis.     fluticasone (FLONASE) 50 MCG/ACT nasal spray Place 2 sprays into the nose daily.     levocetirizine (XYZAL) 5 MG tablet Take 5 mg by mouth every evening.     levothyroxine (SYNTHROID) 50 MCG tablet Take 50 mcg by mouth every morning.     montelukast (SINGULAIR) 10 MG tablet Take 10 mg by mouth at bedtime.     Probiotic Product (ALIGN PO) Take 1 tablet by mouth daily.     valACYclovir (VALTREX)  500 MG tablet Take 1 tablet by mouth daily.     venlafaxine XR (EFFEXOR-XR) 37.5 MG 24 hr capsule Take 37.5 mg by mouth daily.     vitamin B-12 (CYANOCOBALAMIN) 1000 MCG tablet Take 1,000 mcg by mouth 3 (three) times a week.      zolpidem (AMBIEN) 5 MG tablet Take 5 mg by mouth at bedtime as needed for sleep.     doxycycline (VIBRAMYCIN) 100 MG capsule Take 100 mg by mouth 2 (two) times daily.     valACYclovir (VALTREX) 500 MG tablet Take 500 mg by mouth daily.     No current facility-administered medications for this visit.    PHYSICAL EXAMINATION: ECOG PERFORMANCE STATUS: 1 - Symptomatic but completely ambulatory  Vitals:   12/06/22 0854  BP: 114/71  Pulse: 64  Resp: 18  Temp: (!) 97.3 F (36.3 C)  SpO2: 95%   Filed Weights   12/06/22 0854  Weight: 164 lb 4.8 oz (74.5 kg)    Physical Exam          (exam performed in the presence of a chaperone)  LABORATORY DATA:  I have reviewed the data as listed    Latest Ref Rng & Units 04/13/2022    8:56 AM 04/13/2021   10:11 AM 12/01/2020    8:03 AM  CMP  Glucose 70 - 99 mg/dL 829  562  130   BUN 6 - 24  mg/dL 16  15  21    Creatinine 0.57 - 1.00 mg/dL 4.09  8.11  9.14   Sodium 134 - 144 mmol/L 141  140  139   Potassium 3.5 - 5.2 mmol/L 4.7  4.5  4.1   Chloride 96 - 106 mmol/L 102  104  105   CO2 20 - 29 mmol/L 24  23  23    Calcium 8.7 - 10.2 mg/dL 9.9  78.2  9.8   Total Protein 6.0 - 8.5 g/dL 7.2  7.3  7.5   Total Bilirubin 0.0 - 1.2 mg/dL 0.3  0.3  0.3   Alkaline Phos 44 - 121 IU/L 120  100  96   AST 0 - 40 IU/L 25  22  27    ALT 0 - 32 IU/L 27  16  35     Lab Results  Component Value Date   WBC 10.5 12/06/2022   HGB 14.2 12/06/2022   HCT 41.0 12/06/2022   MCV 95.3 12/06/2022   PLT 471 (H) 12/06/2022   NEUTROABS 4.2 12/06/2022    ASSESSMENT & PLAN:  Thrombocytosis (HCC) Thrombocytosis: Review of the labs revealed platelet counts 469 on 06/07/2016. Patient has had mild elevation of platelets atleast for the last 4  years (538 in 2014; 571 in 2016 and 490 six months ago) 11/01/2018: 494 12/03/2019: 486  12/01/2020: 551 11/30/2021: 475 12/06/2022: 471, WBC 10.5, ALC 4.8, AEC 0.8   Platelet count has not changed much in the last 7 years.   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  CD4 negative CD8 negative inflammatory T cells noted.  I will repeat a CLL flow cytometry next year again. On low-dose aspirin  Return to clinic in 1 year for follow-up ------------------------------------- Assessment and Plan    Thrombocytosis Stable platelet count compared to last year. No significant changes in white count, hemoglobin, lymphocytes, eosinophils, or basophils. Discussed potential causes and reassured that current levels do not warrant further workup. -Continue annual monitoring. -Consider flow cytometry next year for completion sake.  Chronic Abdominal Pain Ongoing issue, no recent exacerbation. -Continue current management.  Menopausal Symptoms Reports stiffness and achiness, likely related to menopause. -Advised to stay active and stretch.  General Health Maintenance -Continue low dose aspirin. -Continue levothyroxine. -Plan for annual lab and follow-up in one year.          Orders Placed This Encounter  Procedures   CBC with Differential (Cancer Center Only)    Standing Status:   Future    Standing Expiration Date:   12/06/2023   Flow Cytometry, Peripheral Blood (Oncology)    CLL    Standing Status:   Future    Standing Expiration Date:   12/06/2023   The patient has a good understanding of the overall plan. she agrees with it. she will call with any problems that may develop before the next visit here. Total time spent: 30 mins including face to face time and time spent for planning, charting and co-ordination of care   Tamsen Meek, MD 12/06/22

## 2022-12-06 NOTE — Assessment & Plan Note (Addendum)
Thrombocytosis: Review of the labs revealed platelet counts 469 on 06/07/2016. Patient has had mild elevation of platelets atleast for the last 4 years (538 in 2014; 571 in 2016 and 490 six months ago) 11/01/2018: 494 12/03/2019: 486  12/01/2020: 551 11/30/2021: 475 12/06/2022: 471, WBC 10.5, ALC 4.8, AEC 0.8   Platelet count has not changed much in the last 7 years.   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  CD4 negative CD8 negative inflammatory T cells noted.  I will repeat a CLL flow cytometry next year again. On low-dose aspirin  Return to clinic in 1 year for follow-up

## 2022-12-09 ENCOUNTER — Telehealth: Payer: Self-pay | Admitting: Hematology and Oncology

## 2022-12-09 NOTE — Telephone Encounter (Signed)
Left patient a message regarding scheduled appointment times/dates

## 2023-05-05 ENCOUNTER — Other Ambulatory Visit: Payer: Self-pay

## 2023-05-06 ENCOUNTER — Encounter: Payer: Self-pay | Admitting: Cardiology

## 2023-05-06 ENCOUNTER — Ambulatory Visit: Payer: 59 | Attending: Cardiology | Admitting: Cardiology

## 2023-05-06 VITALS — BP 100/60 | HR 63 | Ht 63.0 in | Wt 167.0 lb

## 2023-05-06 DIAGNOSIS — E782 Mixed hyperlipidemia: Secondary | ICD-10-CM

## 2023-05-06 DIAGNOSIS — Z01810 Encounter for preprocedural cardiovascular examination: Secondary | ICD-10-CM

## 2023-05-06 NOTE — Patient Instructions (Signed)
Medication Instructions:  Your physician recommends that you continue on your current medications as directed. Please refer to the Current Medication list given to you today.  *If you need a refill on your cardiac medications before your next appointment, please call your pharmacy*   Lab Work: Your physician recommends that you return for lab work in: the next few days for CMP and lipids. You need to have labs done when you are fasting. [facility] lab is located on the 3rd floor, [address]. Hours are Monday - Friday 8 am to 4 pm, closed 11:30 am to 1:00 pm. You do NOT need an appointment.   If you have labs (blood work) drawn today and your tests are completely normal, you will receive your results only by: MyChart Message (if you have MyChart) OR A paper copy in the mail If you have any lab test that is abnormal or we need to change your treatment, we will call you to review the results.   Testing/Procedures: None ordered   Follow-Up: At [facility], you and your health needs are our priority.  As part of our continuing mission to provide you with exceptional heart care, we have created designated Provider Care Teams.  These Care Teams include your primary Cardiologist (physician) and Advanced Practice Providers (APPs -  Physician Assistants and Nurse Practitioners) who all work together to provide you with the care you need, when you need it.  We recommend signing up for the patient portal called "MyChart".  Sign up information is provided on this After Visit Summary.  MyChart is used to connect with patients for Virtual Visits (Telemedicine).  Patients are able to view lab/test results, encounter notes, upcoming appointments, etc.  Non-urgent messages can be sent to your provider as well.   To learn more about what you can do with MyChart, go to [email or web address].    Your next appointment:   12 month(s)  The format for your next appointment:   In Person  Provider:    Belva Crome, MD    Other Instructions none  Important Information About Sugar

## 2023-05-06 NOTE — Progress Notes (Signed)
Cardiology Office Note:    Date:  05/06/2023   ID:  Deborah [name], DOB Mar 12, 1966, MRN [id number]  PCP:  Gwenlyn Found, MD  Cardiologist:  Garwin Brothers, MD   Referring MD: Gwenlyn Found, MD    ASSESSMENT:    1. Mixed dyslipidemia   2. Preop cardiovascular exam    PLAN:    In order of problems listed above:  Primary prevention stressed with the patient.  Importance of compliance with diet medication stressed and patient verbalized standing. Mixed dyslipidemia: Lipids reviewed diet emphasized.  She will be back in the next few days for complete blood work including lipids. Preoperative cardiovascular risk stratification: I discussed this with the patient at extensive length.  She has not otherwise active lady.  Her calcium score is 0.  In view of that she is not at high risk for coronary events during the aforementioned surgery.  Meticulous hemodynamic monitoring will further reduce the risk of coronary events.  I told her to involve herself in activities such as swimming to keep herself it.  I told her to check with her primary care and orthopedic doctors if this is okay for her.  She understands. Patient will be seen in follow-up appointment in 12 months or earlier if the patient has any concerns.    Medication Adjustments/Labs and Tests Ordered: Current medicines are reviewed at length with the patient today.  Concerns regarding medicines are outlined above.  Orders Placed This Encounter  Procedures   Comprehensive metabolic panel   Lipid panel   EKG 12-Lead   No orders of the defined types were placed in this encounter.    No chief complaint on file.    History of Present Illness:    Deborah [name] is a 58 y.o. female.  Patient has past medical history of mixed dyslipidemia.  She denies any problems at this time and takes care of activities of daily living.  She is planning to undergo knee surgery in the month of June.  She is an active lady  otherwise.  She is limited with knee issues.  Her calcium score was 0 and 2024.  At the time of my evaluation, the patient is alert awake oriented and in no distress.  Past Medical History:  Diagnosis Date   Abdominal pain, generalized 11/28/2009   Qualifier: Diagnosis of  By: Monica Becton PA-c, Amy S    Abdominal pain, left upper quadrant 05/29/2008   Qualifier: Diagnosis of  By: Nelson-Smith CMA (AAMA), Dottie     ABDOMINAL PAIN-EPIGASTRIC 11/28/2009   Qualifier: Diagnosis of  By: Monica Becton PA-c, Amy S    Abnormal CT scan, colon 12/31/2015   Abnormal weight gain 11/28/2009   Qualifier: Diagnosis of  By: Monica Becton PA-c, Amy S    Allergic rhinitis 12/02/2020   Allergic rhinitis due to animal (cat) (dog) hair and dander 03/25/2021   Allergic rhinitis due to pollen 03/25/2021   Asplenia 08/27/2019   Cardiac murmur 10/27/2018   Chronic allergic conjunctivitis 03/25/2021   Chronic constipation    Chronic pancreatitis (HCC) LOW-GRADE   FOLLOWED BY DR DORA BRODIE   COLONIC POLYPS, HX OF 05/23/2008   Qualifier: Diagnosis of  By: Nelson-Smith CMA (AAMA), Dottie     Cough 03/25/2021   Genital herpes 10/25/2018   Genital herpes in women    History of colonic polyps 05/23/2008   Qualifier: Diagnosis of   By: Candice Camp CMA Duncan Dull), Dottie      IMO SNOMED Dx Update Oct 2024  Hot flashes    Hypothyroidism 08/25/2021   IBS (irritable bowel syndrome)    Insomnia 08/27/2019   Irritable bowel syndrome 05/11/2007   Qualifier: Diagnosis of  By: Gwinda Passe RN, Carissa     Menorrhagia    Mixed dyslipidemia 03/07/2019   PANCREATITIS 05/11/2007   Qualifier: Diagnosis of  By: Gwinda Passe RN, Carissa     Pelvic floor dysfunction 08/31/2021   Perimenopausal    Plantar fasciitis, bilateral 08/27/2019   Prediabetes 06/02/2020   Right knee pain    swelling and cortizone shot given and melxicam   Seasonal allergies    Sleep apnea 04/13/2022   Sleep disorder breathing 08/31/2021   Small intestinal  bacterial overgrowth (SIBO) 08/31/2021   Thrombocytosis 07/13/2016   Vitamin B12 deficiency 03/07/2019   Vitamin D deficiency 03/07/2019    Past Surgical History:  Procedure Laterality Date   ABLATION  2014   APPENDECTOMY  5/21/197   Incidental appy   KNEE ARTHROSCOPY  2004   RIGHT   SPLENECTOMY, TOTAL  08/10/1995   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   atorvastatin (LIPITOR) 10 MG tablet Take 1 tablet (10 mg total) by mouth daily.   Cholecalciferol (VITAMIN D3) 1000 units CAPS Take 3,000 Units by mouth 3 (three) times a week.   Docusate Calcium (STOOL SOFTENER PO) Take 1 tablet by mouth daily.    EPINEPHrine 0.3 mg/0.3 mL IJ SOAJ injection Inject 0.3 mg into the muscle as needed for anaphylaxis.   fluticasone (FLONASE) 50 MCG/ACT nasal spray Place 2 sprays into the nose daily.   levocetirizine (XYZAL) 5 MG tablet Take 5 mg by mouth every evening.   levothyroxine (SYNTHROID) 50 MCG tablet Take 50 mcg by mouth every morning.   montelukast (SINGULAIR) 10 MG tablet Take 10 mg by mouth at bedtime.   Probiotic Product (ALIGN PO) Take 1 tablet by mouth daily.   valACYclovir (VALTREX) 500 MG tablet Take 1 tablet by mouth daily.   venlafaxine XR (EFFEXOR-XR) 37.5 MG 24 hr capsule Take 37.5 mg by mouth daily.   vitamin B-12 (CYANOCOBALAMIN) 1000 MCG tablet Take 1,000 mcg by mouth 3 (three) times a week.    zolpidem (AMBIEN) 5 MG tablet Take 5 mg by mouth at bedtime as needed for sleep.     Allergies:   Amoxicillin, Norgestimate-eth estradiol, Bupropion, Ivp dye [iodinated contrast media], Other, Penicillins, and Meloxicam   Social History   Socioeconomic History   Marital status: Single    Spouse name: Not on file   Number of children: 2   Years of education: Not on file   Highest education level: Not on file  Occupational History   Occupation:  librarian  Tobacco Use   Smoking status: Never   Smokeless tobacco: Never  Vaping Use   Vaping status: Never Used  Substance and Sexual Activity   Alcohol use: No   Drug use: No   Sexual activity: Not Currently  Other Topics Concern   Not on file  Social History Narrative   Not on file   Social Drivers of Health   Financial Resource Strain: Not on file  Food Insecurity: Low Risk  (01/19/2023)   Received from [facility]   Hunger Vital Sign    Worried About Running Out of Food in the Last Year: Never true    Ran Out of Food  in the Last Year: Never true  Transportation Needs: No Transportation Needs (01/19/2023)   Received from [facility]    In the past 12 months, has lack of reliable transportation kept you from medical appointments, meetings, work or from getting things needed for daily living? : No  Physical Activity: Not on file  Stress: Not on file  Social Connections: Not on file     Family History: The patient's family history includes CAD in her father; Cancer in her father, mother, and paternal grandmother; Depression in her father and sister; Diabetes in her paternal grandfather; Diverticulitis in her father; Hearing loss in her maternal grandmother; Thyroid nodules in her sister; Ulcerative colitis in her mother.  ROS:   Please see the history of present illness.    All other systems reviewed and are negative.  EKGs/Labs/Other Studies Reviewed:    The following studies were reviewed today: .Marland KitchenEKG Interpretation Date/Time:  Friday May 06 2023 13:26:12 EST Ventricular Rate:  63 PR Interval:  138 QRS Duration:  80 QT Interval:  410 QTC Calculation: 419 R Axis:   36  Text Interpretation: Normal sinus rhythm Cannot rule out Anterior infarct , age undetermined No previous ECGs available Confirmed by Belva Crome [phone]) on 05/06/2023 1:36:33 PM     Recent Labs: 12/06/2022: Hemoglobin 14.2; Platelet Count 471  Recent Lipid Panel     Component Value Date/Time   CHOL 190 04/13/2022 0856   TRIG 85 04/13/2022 0856   HDL 72 04/13/2022 0856   CHOLHDL 2.6 04/13/2022 0856   CHOLHDL 3 08/27/2019 0739   VLDL 16.2 08/27/2019 0739   LDLCALC 103 (H) 04/13/2022 0856    Physical Exam:    VS:  BP 100/60   Pulse 63   Ht 5\' 3"  (1.6 m)   Wt 167 lb 0.6 oz (75.8 kg)   SpO2 94%   BMI 29.59 kg/m     Wt Readings from Last 3 Encounters:  05/06/23 167 lb 0.6 oz (75.8 kg)  12/06/22 164 lb 4.8 oz (74.5 kg)  10/09/22 160 lb (72.6 kg)     GEN: Patient is in no acute distress HEENT: Normal NECK: No JVD; No carotid bruits LYMPHATICS: No lymphadenopathy CARDIAC: Hear sounds regular, 2/6 systolic murmur at the apex. RESPIRATORY:  Clear to auscultation without rales, wheezing or rhonchi  ABDOMEN: Soft, non-tender, non-distended MUSCULOSKELETAL:  No edema; No deformity  SKIN: Warm and dry NEUROLOGIC:  Alert and oriented x 3 PSYCHIATRIC:  Normal affect   Signed, Garwin Brothers, MD  05/06/2023 2:00 PM    [facility]

## 2023-05-19 LAB — COMPREHENSIVE METABOLIC PANEL
ALT: 33 IU/L — ABNORMAL HIGH (ref 0–32)
AST: 32 IU/L (ref 0–40)
Albumin: 4.3 g/dL (ref 3.8–4.9)
Alkaline Phosphatase: 124 IU/L — ABNORMAL HIGH (ref 44–121)
BUN/Creatinine Ratio: 25 — ABNORMAL HIGH (ref 9–23)
BUN: 19 mg/dL (ref 6–24)
Bilirubin Total: 0.2 mg/dL (ref 0.0–1.2)
CO2: 22 mmol/L (ref 20–29)
Calcium: 9.5 mg/dL (ref 8.7–10.2)
Chloride: 103 mmol/L (ref 96–106)
Creatinine, Ser: 0.77 mg/dL (ref 0.57–1.00)
Globulin, Total: 2.6 g/dL (ref 1.5–4.5)
Glucose: 106 mg/dL — ABNORMAL HIGH (ref 70–99)
Potassium: 4.6 mmol/L (ref 3.5–5.2)
Sodium: 139 mmol/L (ref 134–144)
Total Protein: 6.9 g/dL (ref 6.0–8.5)
eGFR: 90 mL/min/{1.73_m2} (ref >59)

## 2023-05-19 LAB — LIPID PANEL
Chol/HDL Ratio: 2.7 ratio (ref 0.0–4.4)
Cholesterol, Total: 187 mg/dL (ref 100–199)
HDL: 69 mg/dL (ref >39)
LDL Chol Calc (NIH): 105 mg/dL — ABNORMAL HIGH (ref 0–99)
Triglycerides: 69 mg/dL (ref 0–149)
VLDL Cholesterol Cal: 13 mg/dL (ref 5–40)

## 2023-05-22 ENCOUNTER — Other Ambulatory Visit: Payer: Self-pay | Admitting: Cardiology

## 2023-05-22 DIAGNOSIS — E782 Mixed hyperlipidemia: Secondary | ICD-10-CM

## 2023-05-27 ENCOUNTER — Encounter: Payer: Self-pay | Admitting: Hematology and Oncology

## 2023-05-31 ENCOUNTER — Telehealth: Payer: Self-pay

## 2023-05-31 NOTE — Telephone Encounter (Signed)
   [facility] Pre-operative Risk Assessment    Request for surgical clearance:  What type of surgery is being performed? Right total knee arthroplasty   When is this surgery scheduled? TBD   What type of clearance is required (medical clearance vs. Pharmacy clearance to hold med vs. Both)? Both   Are there any medications that need to be held prior to surgery and how long?Not specified   Practice name and name of physician performing surgery? Dr. Gean Birchwood at [facility]   What is your office phone number: [phone]    7.   What is your office fax number: [phone]  8.   Anesthesia type (None, local, MAC, general) ? Not specified   Deborah [name] 05/31/2023, 9:50 AM  _________________________________________________________________   (provider comments below)

## 2023-06-01 NOTE — Telephone Encounter (Signed)
   Name: Deborah [name]  DOB: 1965/04/24  MRN: [id number]   Primary Cardiologist: None  Chart reviewed as part of pre-operative protocol coverage.  The patient was seen by Dr. Tomie China on 05/06/2023 for surgical clearance.  Please see his note which will be faxed separately. Please call with questions.  Tereso Newcomer, PA-C 06/01/2023, 8:29 AM

## 2023-06-01 NOTE — Telephone Encounter (Signed)
 Notes faxed to surgeon. This phone note will be removed from the preop pool. Tereso Newcomer, PA-C  06/01/2023 8:31 AM

## 2023-06-22 ENCOUNTER — Encounter: Payer: Self-pay | Admitting: *Deleted

## 2023-06-22 NOTE — Progress Notes (Signed)
 Received preoperative risk assessment from [facility] for right total knee arthroplasty.  Per MD okay for pt to proceed with no precautions.  RN successfully faxed completed form to [phone].

## 2023-07-27 ENCOUNTER — Telehealth: Payer: Self-pay | Admitting: Cardiology

## 2023-07-27 ENCOUNTER — Telehealth: Payer: Self-pay | Admitting: *Deleted

## 2023-07-27 NOTE — Telephone Encounter (Signed)
 Pt has been scheduled tele preop appt 08/02/23. Pt states trying to get surgery done before the 1st in June, surgeon will not schedule until pt has been cleared. Med rec and consent are done.     Patient Consent for Virtual Visit        Deborah [name] has provided verbal consent on 07/27/2023 for a virtual visit (video or telephone).   CONSENT FOR VIRTUAL VISIT FOR:  Deborah [name]  By participating in this virtual visit I agree to the following:  I hereby voluntarily request, consent and authorize [facility] and its employed or contracted physicians, physician assistants, nurse practitioners or other licensed health care professionals (the Practitioner), to provide me with telemedicine health care services (the "Services") as deemed necessary by the treating Practitioner. I acknowledge and consent to receive the Services by the Practitioner via telemedicine. I understand that the telemedicine visit will involve communicating with the Practitioner through live audiovisual communication technology and the disclosure of certain medical information by electronic transmission. I acknowledge that I have been given the opportunity to request an in-person assessment or other available alternative prior to the telemedicine visit and am voluntarily participating in the telemedicine visit.  I understand that I have the right to withhold or withdraw my consent to the use of telemedicine in the course of my care at any time, without affecting my right to future care or treatment, and that the Practitioner or I may terminate the telemedicine visit at any time. I understand that I have the right to inspect all information obtained and/or recorded in the course of the telemedicine visit and may receive copies of available information for a reasonable fee.  I understand that some of the potential risks of receiving the Services via telemedicine include:  Delay or interruption in medical  evaluation due to technological equipment failure or disruption; Information transmitted may not be sufficient (e.g. poor resolution of images) to allow for appropriate medical decision making by the Practitioner; and/or  In rare instances, security protocols could fail, causing a breach of personal health information.  Furthermore, I acknowledge that it is my responsibility to provide information about my medical history, conditions and care that is complete and accurate to the best of my ability. I acknowledge that Practitioner's advice, recommendations, and/or decision may be based on factors not within their control, such as incomplete or inaccurate data provided by me or distortions of diagnostic images or specimens that may result from electronic transmissions. I understand that the practice of medicine is not an exact science and that Practitioner makes no warranties or guarantees regarding treatment outcomes. I acknowledge that a copy of this consent can be made available to me via my patient portal [facility] MyChart), or I can request a printed copy by calling the office of [facility].    I understand that my insurance will be billed for this visit.   I have read or had this consent read to me. I understand the contents of this consent, which adequately explains the benefits and risks of the Services being provided via telemedicine.  I have been provided ample opportunity to ask questions regarding this consent and the Services and have had my questions answered to my satisfaction. I give my informed consent for the services to be provided through the use of telemedicine in my medical care

## 2023-07-27 NOTE — Telephone Encounter (Signed)
   Name: Deborah [name]  DOB: [birth year]  MRN: [id number]  Primary Cardiologist: None  Preoperative team, please contact this patient and set up a phone call appointment for further preoperative risk assessment. Please obtain consent and complete medication review. Thank you for your help.  I confirm that guidance regarding antiplatelet and oral anticoagulation therapy has been completed and, if necessary, noted below.   Patient can hold aspirin  for 5 to 7 days prior to procedure.  Please resume when safe to do so from a bleeding standpoint.  I also confirmed the patient resides in the state of [place] . As per [facility] Medical Board telemedicine laws, the patient must reside in the state in which the provider is licensed.   Seira Cody D Trang Bouse, NP 07/27/2023, 4:14 PM [facility]

## 2023-07-27 NOTE — Telephone Encounter (Signed)
 Pt has been scheduled tele preop appt 08/02/23. Pt states trying to get surgery done before the 1st in June, surgeon will not schedule until pt has been cleared. Med rec and consent are done.

## 2023-07-27 NOTE — Telephone Encounter (Signed)
   Pre-operative Risk Assessment    Patient Name: Deborah [name]  DOB: [birth year] MRN: [id number]   Date of last office visit: 05/06/23 Date of next office visit: Not yet scheduled   Request for Surgical Clearance    Procedure:   Right Total Knee Replacement  Date of Surgery:  Clearance TBD                                Surgeon:  Dr. Marletta Simmering Group or Practice Name:  [facility] Phone number:  [phone]  Fax number:  [phone]   Type of Clearance Requested:   - Medical  - Pharmacy:  Hold        Type of Anesthesia:  Spinal   Additional requests/questions:   Caller Marily Shows) stated patient will need medical and pharmacy clearance.  Signed, Jasmin B Wilson   07/27/2023, 3:51 PM

## 2023-08-02 ENCOUNTER — Ambulatory Visit: Attending: Internal Medicine | Admitting: Student

## 2023-08-02 DIAGNOSIS — Z01810 Encounter for preprocedural cardiovascular examination: Secondary | ICD-10-CM

## 2023-08-02 NOTE — Progress Notes (Signed)
 Virtual Visit via Telephone Note   Because of Deborah [name]'s co-morbid illnesses, she is at least at moderate risk for complications without adequate follow up.  This format is felt to be most appropriate for this patient at this time.  The patient did not have access to video technology/had technical difficulties with video requiring transitioning to audio format only (telephone).  All issues noted in this document were discussed and addressed.  No physical exam could be performed with this format.  Please refer to the patient's chart for her consent to telehealth for [facility].  Evaluation Performed:  Preoperative cardiovascular risk assessment _____________   Date:  08/02/2023   Patient ID:  Deborah [name], DOB [birth year], MRN [id number] Patient Location:  Home Provider location:   Office  Primary Care Provider:  Audria Leather, MD Primary Cardiologist:  Nelia Balzarine, MD  Chief Complaint / Patient Profile   58 y.o. y/o female with a h/o hyperlipidemia, hypothyroidism who is pending right total knee replacement by Dr. Carry [name] and presents today for telephonic preoperative cardiovascular risk assessment.  History of Present Illness    Deborah [name] is a 58 y.o. female who presents via audio/video conferencing for a telehealth visit today.  Pt was last seen in cardiology clinic on 05/06/2023 by Dr. Lafayette Pierre.  At that time Deborah [name] was stable from a cardiac standpoint.  The patient is now pending procedure as outlined above. Since her last visit, she is doing well. Patient denies shortness of breath, dyspnea on exertion, lower extremity edema, orthopnea or PND. No chest pain, pressure, or tightness. No palpitations. She is very active walking up to 3 miles 4 days a week and light weight training 2 days a week.   Past Medical History    Past Medical History:  Diagnosis Date   Abdominal pain, generalized 11/28/2009   Qualifier: Diagnosis  of  By: Francisca Irvine PA-c, Amy S    Abdominal pain, left upper quadrant 05/29/2008   Qualifier: Diagnosis of  By: Nelson-Smith CMA (AAMA), Dottie     ABDOMINAL PAIN-EPIGASTRIC 11/28/2009   Qualifier: Diagnosis of  By: Francisca Irvine PA-c, Amy S    Abnormal CT scan, colon 12/31/2015   Abnormal weight gain 11/28/2009   Qualifier: Diagnosis of  By: Francisca Irvine PA-c, Amy S    Allergic rhinitis 12/02/2020   Allergic rhinitis due to animal (cat) (dog) hair and dander 03/25/2021   Allergic rhinitis due to pollen 03/25/2021   Asplenia 08/27/2019   Cardiac murmur 10/27/2018   Chronic allergic conjunctivitis 03/25/2021   Chronic constipation    Chronic pancreatitis (HCC) LOW-GRADE   FOLLOWED BY DR DORA BRODIE   COLONIC POLYPS, HX OF 05/23/2008   Qualifier: Diagnosis of  By: Nelson-Smith CMA (AAMA), Dottie     Cough 03/25/2021   Genital herpes 10/25/2018   Genital herpes in women    History of colonic polyps 05/23/2008   Qualifier: Diagnosis of   By: Misty Amour CMA (AAMA), Dottie      IMO SNOMED Dx Update Oct 2024     Hot flashes    Hypothyroidism 08/25/2021   IBS (irritable bowel syndrome)    Insomnia 08/27/2019   Irritable bowel syndrome 05/11/2007   Qualifier: Diagnosis of  By: Hercules Lombard RN, Carissa     Menorrhagia    Mixed dyslipidemia 03/07/2019   PANCREATITIS 05/11/2007   Qualifier: Diagnosis of  By: Hercules Lombard RN, Carissa     Pelvic floor dysfunction 08/31/2021   Perimenopausal    Plantar  fasciitis, bilateral 08/27/2019   Prediabetes 06/02/2020   Right knee pain    swelling and cortizone shot given and melxicam   Seasonal allergies    Sleep apnea 04/13/2022   Sleep disorder breathing 08/31/2021   Small intestinal bacterial overgrowth (SIBO) 08/31/2021   Thrombocytosis 07/13/2016   Vitamin B12 deficiency 03/07/2019   Vitamin D deficiency 03/07/2019   Past Surgical History:  Procedure Laterality Date   ABLATION  2014   APPENDECTOMY  5/21/197   Incidental appy   KNEE ARTHROSCOPY   2004   RIGHT   SPLENECTOMY, TOTAL  08/10/1995   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 Authorizing Provider  aspirin  EC 81 MG tablet Take 1 tablet (81 mg total) by mouth daily. 11/20/18   Cameron Cea, MD  atorvastatin  (LIPITOR) 10 MG tablet Take 1 tablet (10 mg total) by mouth daily. 05/23/23   Revankar, Rajan R, MD  Cholecalciferol (VITAMIN D3) 1000 units CAPS Take 3,000 Units by mouth 3 (three) times a week.    [provider]  Docusate Calcium  (STOOL SOFTENER PO) Take 1 tablet by mouth daily.     [provider]  EPINEPHrine 0.3 mg/0.3 mL IJ SOAJ injection Inject 0.3 mg into the muscle as needed for anaphylaxis.    [provider]  fluticasone (FLONASE) 50 MCG/ACT nasal spray Place 2 sprays into the nose daily. 06/25/13   [provider]  levocetirizine (XYZAL) 5 MG tablet Take 5 mg by mouth every evening.    [provider]  levothyroxine (SYNTHROID) 50 MCG tablet Take 50 mcg by mouth every morning. 10/29/21   [provider]  montelukast (SINGULAIR) 10 MG tablet Take 10 mg by mouth at bedtime.    [provider]  Probiotic Product (ALIGN PO) Take 1 tablet by mouth daily.    [provider]  valACYclovir (VALTREX) 500 MG tablet Take 1 tablet by mouth daily. 04/17/21   [provider]  venlafaxine XR (EFFEXOR-XR) 37.5 MG 24 hr capsule Take 37.5 mg by mouth daily. 11/09/21   [provider]  vitamin B-12 (CYANOCOBALAMIN) 1000 MCG tablet Take 1,000 mcg by mouth 3 (three) times a week.     [provider]  zolpidem (AMBIEN) 5 MG  tablet Take 5 mg by mouth at bedtime as needed for sleep. 03/08/21   [provider]    Physical Exam    Vital Signs:  Deborah [name] does not have vital signs available for review today.  Given telephonic nature of communication, physical exam is limited. AAOx3. NAD. Normal affect.  Speech and respirations are unlabored.   Assessment & Plan    Primary Cardiologist: Nelia Balzarine, MD  Preoperative cardiovascular risk assessment.  Right total knee replacement by Dr. Carry [name]  Chart reviewed as part of pre-operative protocol coverage. According to the RCRI, patient has a 0.4% risk of MACE. Patient reports activity equivalent to >4.0 METS (walks up to 3 miles 4 days a week and light weight training 2 days a week).   Given past medical history and time since  last visit, based on ACC/AHA guidelines, Deborah [name] would be at acceptable risk for the planned procedure without further cardiovascular testing.   Patient was advised that if she develops new symptoms prior to surgery to contact our office to arrange a follow-up appointment.  she verbalized understanding.  Ideally aspirin  should be continued without interruption, however if the bleeding risk is too great, aspirin  may be held for 5-7 days prior to surgery. Please resume aspirin  post operatively when it is felt to be safe from a bleeding standpoint.    I will route this recommendation to the requesting party via Epic fax function.  Please call with questions.  Time:   Today, I have spent 5 minutes with the patient with telehealth technology discussing medical history, symptoms, and management plan.     Morey Ar, NP  08/02/2023, 7:59 AM

## 2023-12-06 ENCOUNTER — Inpatient Hospital Stay

## 2023-12-06 ENCOUNTER — Inpatient Hospital Stay: Payer: 59 | Attending: Hematology and Oncology | Admitting: Hematology and Oncology

## 2023-12-06 VITALS — BP 120/80 | HR 62 | Temp 97.9°F | Resp 18 | Ht 63.0 in | Wt 168.1 lb

## 2023-12-06 DIAGNOSIS — D75839 Thrombocytosis, unspecified: Secondary | ICD-10-CM

## 2023-12-06 DIAGNOSIS — D72820 Lymphocytosis (symptomatic): Secondary | ICD-10-CM | POA: Insufficient documentation

## 2023-12-06 DIAGNOSIS — Z7982 Long term (current) use of aspirin: Secondary | ICD-10-CM | POA: Insufficient documentation

## 2023-12-06 LAB — CBC WITH DIFFERENTIAL (CANCER CENTER ONLY)
Abs Immature Granulocytes: 0.01 K/uL (ref 0.00–0.07)
Basophils Absolute: 0.2 K/uL — ABNORMAL HIGH (ref 0.0–0.1)
Basophils Relative: 2 %
Eosinophils Absolute: 0.5 K/uL (ref 0.0–0.5)
Eosinophils Relative: 6 %
HCT: 41 % (ref 36.0–46.0)
Hemoglobin: 14.2 g/dL (ref 12.0–15.0)
Immature Granulocytes: 0 %
Lymphocytes Relative: 38 %
Lymphs Abs: 3 K/uL (ref 0.7–4.0)
MCH: 32.6 pg (ref 26.0–34.0)
MCHC: 34.6 g/dL (ref 30.0–36.0)
MCV: 94.3 fL (ref 80.0–100.0)
Monocytes Absolute: 0.5 K/uL (ref 0.1–1.0)
Monocytes Relative: 7 %
Neutro Abs: 3.9 K/uL (ref 1.7–7.7)
Neutrophils Relative %: 47 %
Platelet Count: 493 K/uL — ABNORMAL HIGH (ref 150–400)
RBC: 4.35 MIL/uL (ref 3.87–5.11)
RDW: 14 % (ref 11.5–15.5)
WBC Count: 8 K/uL (ref 4.0–10.5)
nRBC: 0 % (ref 0.0–0.2)

## 2023-12-06 NOTE — Progress Notes (Signed)
 Patient Care Team: Leila Lucie LABOR, MD as PCP - General (Family Medicine) Revankar, Jennifer SAUNDERS, MD as PCP - Cardiology (Cardiology)  DIAGNOSIS:  Encounter Diagnosis  Name Primary?   Thrombocytosis Yes    CHIEF COMPLIANT: Follow-up of thrombocytosis and lymphocytosis  HISTORY OF PRESENT ILLNESS: History of Present Illness Deborah [name] is a 58 year old female who presents for follow-up of her hematological lab results.  Her platelet count is mildly elevated at 493, a consistent finding in past lab results. Lymphocyte levels, previously elevated, have returned to normal. Flow cytometry was performed to rule out chronic lymphocytic leukemia.     ALLERGIES:  is allergic to amoxicillin, norgestimate-eth estradiol, bupropion, ivp dye [iodinated contrast media], other, penicillins, and meloxicam.  MEDICATIONS:  Current Outpatient Medications  Medication Sig Dispense Refill   aspirin  EC 81 MG tablet Take 1 tablet (81 mg total) by mouth daily.     atorvastatin  (LIPITOR) 10 MG tablet Take 1 tablet (10 mg total) by mouth daily. 90 tablet 2   celecoxib (CELEBREX) 200 MG capsule 200 mg.     Cholecalciferol (VITAMIN D3) 1000 units CAPS Take 3,000 Units by mouth 3 (three) times a week.     Docusate Calcium  (STOOL SOFTENER PO) Take 1 tablet by mouth daily.      EPINEPHrine 0.3 mg/0.3 mL IJ SOAJ injection Inject 0.3 mg into the muscle as needed for anaphylaxis.     fluticasone (FLONASE) 50 MCG/ACT nasal spray Place 2 sprays into the nose daily.     levocetirizine (XYZAL) 5 MG tablet Take 5 mg by mouth every evening.     levothyroxine (SYNTHROID) 50 MCG tablet Take 50 mcg by mouth every morning.     montelukast (SINGULAIR) 10 MG tablet Take 10 mg by mouth at bedtime.     Probiotic Product (ALIGN PO) Take 1 tablet by mouth daily.     valACYclovir (VALTREX) 500 MG tablet Take 1 tablet by mouth daily.     venlafaxine XR (EFFEXOR-XR) 37.5 MG 24 hr capsule Take 37.5 mg by mouth daily.      vitamin B-12 (CYANOCOBALAMIN) 1000 MCG tablet Take 1,000 mcg by mouth 3 (three) times a week.      zolpidem (AMBIEN) 5 MG tablet Take 5 mg by mouth at bedtime as needed for sleep.     No current facility-administered medications for this visit.    PHYSICAL EXAMINATION: ECOG PERFORMANCE STATUS: 1 - Symptomatic but completely ambulatory  Vitals:   12/06/23 0900  BP: 120/80  Pulse: 62  Resp: 18  Temp: 97.9 F (36.6 C)  SpO2: 99%   Filed Weights   12/06/23 0900  Weight: 168 lb 1.6 oz (76.2 kg)    LABORATORY DATA:  I have reviewed the data as listed    Latest Ref Rng & Units 05/18/2023    8:26 AM 04/13/2022    8:56 AM 04/13/2021   10:11 AM  CMP  Glucose 70 - 99 mg/dL 893  897  897   BUN 6 - 24 mg/dL 19  16  15    Creatinine 0.57 - 1.00 mg/dL 9.22  9.13  9.15   Sodium 134 - 144 mmol/L 139  141  140   Potassium 3.5 - 5.2 mmol/L 4.6  4.7  4.5   Chloride 96 - 106 mmol/L 103  102  104   CO2 20 - 29 mmol/L 22  24  23    Calcium  8.7 - 10.2 mg/dL 9.5  9.9  89.9   Total Protein  6.0 - 8.5 g/dL 6.9  7.2  7.3   Total Bilirubin 0.0 - 1.2 mg/dL 0.2  0.3  0.3   Alkaline Phos 44 - 121 IU/L 124  120  100   AST 0 - 40 IU/L 32  25  22   ALT 0 - 32 IU/L 33  27  16     Lab Results  Component Value Date   WBC 8.0 12/06/2023   HGB 14.2 12/06/2023   HCT 41.0 12/06/2023   MCV 94.3 12/06/2023   PLT 493 (H) 12/06/2023   NEUTROABS 3.9 12/06/2023    ASSESSMENT & PLAN:  Thrombocytosis (HCC) Thrombocytosis: Review of the labs revealed platelet counts 469 on 06/07/2016. Patient has had mild elevation of platelets atleast for the last 4 years (538 in 2014; 571 in 2016 and 490 six months ago) 11/01/2018: 494 12/03/2019: 486  12/01/2020: 551 11/30/2021: 475 12/06/2022: 471, WBC 10.5, ALC 4.8, AEC 0.8 12/06/2023: 493, WBC 8, ALC 3   Platelet count has not changed much in the last 7 years.   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  CD4  negative CD8 negative inflammatory T cells noted.  Flow cytometry pending from today.  The absolute lymphocyte count is back down to normal at 3  on low-dose aspirin    Return to clinic in 1 year for follow-up   Orders Placed This Encounter  Procedures   CBC with Differential (Cancer Center Only)    Standing Status:   Future    Expiration Date:   12/05/2024   The patient has a good understanding of the overall plan. she agrees with it. she will call with any problems that may develop before the next visit here. Total time spent: 30 mins including face to face time and time spent for planning, charting and co-ordination of care   Naomi MARLA Chad, MD 12/06/23

## 2023-12-06 NOTE — Assessment & Plan Note (Signed)
Thrombocytosis: Review of the labs revealed platelet counts 469 on 06/07/2016. Patient has had mild elevation of platelets atleast for the last 4 years (538 in 2014; 571 in 2016 and 490 six months ago) 11/01/2018: 494 12/03/2019: 486  12/01/2020: 551 11/30/2021: 475 12/06/2022: 471, WBC 10.5, ALC 4.8, AEC 0.8   Platelet count has not changed much in the last 7 years.   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  CD4 negative CD8 negative inflammatory T cells noted.  I will repeat a CLL flow cytometry next year again. On low-dose aspirin  Return to clinic in 1 year for follow-up

## 2023-12-07 LAB — SURGICAL PATHOLOGY

## 2023-12-08 LAB — FLOW CYTOMETRY

## 2024-01-22 ENCOUNTER — Other Ambulatory Visit: Payer: Self-pay | Admitting: Cardiology

## 2024-01-22 DIAGNOSIS — E782 Mixed hyperlipidemia: Secondary | ICD-10-CM

## 2024-12-10 ENCOUNTER — Other Ambulatory Visit

## 2024-12-10 ENCOUNTER — Ambulatory Visit: Admitting: Hematology and Oncology
